# Patient Record
Sex: Male | Born: 1955 | Race: White | Hispanic: No | Marital: Married | Smoking: Former smoker
Health system: Southern US, Community
[De-identification: ages and names within clinical notes are randomized; demographics above are authoritative.]

## PROBLEM LIST (undated history)

## (undated) DIAGNOSIS — I10 Essential (primary) hypertension: Secondary | ICD-10-CM

## (undated) DIAGNOSIS — Z139 Encounter for screening, unspecified: Secondary | ICD-10-CM

## (undated) DIAGNOSIS — F419 Anxiety disorder, unspecified: Secondary | ICD-10-CM

## (undated) DIAGNOSIS — E785 Hyperlipidemia, unspecified: Secondary | ICD-10-CM

## (undated) HISTORY — DX: Encounter for screening, unspecified: Z13.9

## (undated) HISTORY — DX: Essential (primary) hypertension: I10

## (undated) HISTORY — DX: Anxiety disorder, unspecified: F41.9

## (undated) HISTORY — DX: Hyperlipidemia, unspecified: E78.5

---

## 1996-06-20 HISTORY — PX: BACK SURGERY: SHX140

## 2000-01-31 ENCOUNTER — Emergency Department (HOSPITAL_COMMUNITY): Admission: EM | Admit: 2000-01-31 | Discharge: 2000-01-31 | Payer: Self-pay | Admitting: Emergency Medicine

## 2004-06-10 ENCOUNTER — Emergency Department (HOSPITAL_COMMUNITY): Admission: EM | Admit: 2004-06-10 | Discharge: 2004-06-10 | Payer: Self-pay | Admitting: Emergency Medicine

## 2006-03-06 ENCOUNTER — Ambulatory Visit: Payer: Self-pay | Admitting: Gastroenterology

## 2006-03-22 ENCOUNTER — Ambulatory Visit: Payer: Self-pay | Admitting: Gastroenterology

## 2007-03-21 ENCOUNTER — Encounter: Admission: RE | Admit: 2007-03-21 | Discharge: 2007-03-21 | Payer: Self-pay | Admitting: Neurosurgery

## 2008-05-28 ENCOUNTER — Ambulatory Visit: Payer: Self-pay | Admitting: Cardiology

## 2008-05-28 ENCOUNTER — Encounter: Payer: Self-pay | Admitting: Internal Medicine

## 2008-05-28 LAB — CONVERTED CEMR LAB
ALT: 20 units/L (ref 0–53)
AST: 15 units/L (ref 0–37)
Albumin: 4.5 g/dL (ref 3.5–5.2)
Alkaline Phosphatase: 65 units/L (ref 39–117)
BUN: 13 mg/dL (ref 6–23)
CO2: 26 meq/L (ref 19–32)
CRP: 0.1 mg/dL (ref <0.6)
Calcium: 9.2 mg/dL (ref 8.4–10.5)
Chloride: 105 meq/L (ref 96–112)
Cholesterol: 235 mg/dL — ABNORMAL HIGH (ref 0–200)
Creatinine, Ser: 0.75 mg/dL (ref 0.40–1.50)
Glucose, Bld: 106 mg/dL — ABNORMAL HIGH (ref 70–99)
HDL: 41 mg/dL (ref >39)
LDL Cholesterol: 153 mg/dL — ABNORMAL HIGH (ref 0–99)
Potassium: 4.4 meq/L (ref 3.5–5.3)
Sodium: 142 meq/L (ref 135–145)
Total Bilirubin: 0.7 mg/dL (ref 0.3–1.2)
Total CHOL/HDL Ratio: 5.7
Total Protein: 6.5 g/dL (ref 6.0–8.3)
Triglycerides: 203 mg/dL — ABNORMAL HIGH (ref <150)
VLDL: 41 mg/dL — ABNORMAL HIGH (ref 0–40)

## 2008-05-30 ENCOUNTER — Ambulatory Visit: Payer: Self-pay | Admitting: Internal Medicine

## 2008-06-04 ENCOUNTER — Ambulatory Visit: Payer: Self-pay

## 2008-06-04 ENCOUNTER — Ambulatory Visit: Payer: Self-pay | Admitting: Cardiology

## 2008-06-04 DIAGNOSIS — Z139 Encounter for screening, unspecified: Secondary | ICD-10-CM

## 2008-06-04 DIAGNOSIS — I10 Essential (primary) hypertension: Secondary | ICD-10-CM | POA: Insufficient documentation

## 2008-06-04 DIAGNOSIS — E785 Hyperlipidemia, unspecified: Secondary | ICD-10-CM

## 2008-06-04 HISTORY — DX: Encounter for screening, unspecified: Z13.9

## 2008-08-22 ENCOUNTER — Ambulatory Visit: Payer: Self-pay | Admitting: Internal Medicine

## 2008-08-22 ENCOUNTER — Encounter: Payer: Self-pay | Admitting: Internal Medicine

## 2008-08-26 ENCOUNTER — Ambulatory Visit: Payer: Self-pay | Admitting: Cardiology

## 2008-08-26 ENCOUNTER — Encounter: Payer: Self-pay | Admitting: Internal Medicine

## 2008-08-26 LAB — CONVERTED CEMR LAB
ALT: 23 units/L (ref 0–53)
AST: 18 units/L (ref 0–37)
Albumin: 4.2 g/dL (ref 3.5–5.2)
Alkaline Phosphatase: 65 units/L (ref 39–117)
BUN: 17 mg/dL (ref 6–23)
CO2: 25 meq/L (ref 19–32)
Calcium: 9.2 mg/dL (ref 8.4–10.5)
Chloride: 107 meq/L (ref 96–112)
Cholesterol: 156 mg/dL (ref 0–200)
Creatinine, Ser: 0.77 mg/dL (ref 0.40–1.50)
Glucose, Bld: 94 mg/dL (ref 70–99)
HDL: 54 mg/dL (ref >39)
LDL Cholesterol: 86 mg/dL (ref 0–99)
Potassium: 4.6 meq/L (ref 3.5–5.3)
Sodium: 143 meq/L (ref 135–145)
Total Bilirubin: 0.4 mg/dL (ref 0.3–1.2)
Total CHOL/HDL Ratio: 2.9
Total Protein: 6.4 g/dL (ref 6.0–8.3)
Triglycerides: 82 mg/dL (ref <150)
VLDL: 16 mg/dL (ref 0–40)

## 2009-05-27 ENCOUNTER — Ambulatory Visit: Payer: Self-pay | Admitting: Internal Medicine

## 2009-06-22 ENCOUNTER — Telehealth: Payer: Self-pay | Admitting: Internal Medicine

## 2010-03-05 ENCOUNTER — Ambulatory Visit: Payer: Self-pay | Admitting: Internal Medicine

## 2010-06-02 ENCOUNTER — Telehealth: Payer: Self-pay | Admitting: Internal Medicine

## 2010-06-04 ENCOUNTER — Ambulatory Visit: Payer: Self-pay

## 2010-07-18 LAB — CONVERTED CEMR LAB
ALT: 26 units/L (ref 0–53)
AST: 20 units/L (ref 0–37)
Albumin: 4.9 g/dL (ref 3.5–5.2)
Alkaline Phosphatase: 60 units/L (ref 39–117)
BUN: 14 mg/dL (ref 6–23)
CO2: 25 meq/L (ref 19–32)
Calcium: 9.4 mg/dL (ref 8.4–10.5)
Chloride: 103 meq/L (ref 96–112)
Cholesterol: 175 mg/dL (ref 0–200)
Creatinine, Ser: 0.7 mg/dL (ref 0.40–1.50)
Glucose, Bld: 98 mg/dL (ref 70–99)
HDL: 49 mg/dL (ref >39)
LDL Cholesterol: 94 mg/dL (ref 0–99)
Potassium: 3.9 meq/L (ref 3.5–5.3)
Sodium: 137 meq/L (ref 135–145)
Total Bilirubin: 0.7 mg/dL (ref 0.3–1.2)
Total CHOL/HDL Ratio: 3.6
Total CK: 204 units/L (ref 7–232)
Total Protein: 6.9 g/dL (ref 6.0–8.3)
Triglycerides: 159 mg/dL — ABNORMAL HIGH (ref <150)
VLDL: 32 mg/dL (ref 0–40)

## 2010-07-22 NOTE — Assessment & Plan Note (Signed)
Summary: ROV/ALT  Medications Added RED YEAST RICE 600 MG CAPS (RED YEAST RICE EXTRACT) two times a day by mouth      Allergies Added: NKDA  Visit Type:  Follow-up Primary Provider:  Julieanne Manson, M.D.  CC:  c/o muscle aches.  Denies chest pain or shortness of breath..  History of Present Illness: Danny Navarro is a delightful 55 year old male with history of hypertension and hyperlipidemia who presents today for routine followup.  He underwent a screening treadmill test and went 15 and a half minutes on a Bruce protocol. There was no chest pain or shortness of breath. He had nonspecific upsloping ST changes. We recently started him on simvastatin. Returns for routine f/u.  Overall doing well. Continues to ride his bike. No CP or SOB. Tolerating statin ok. Continues to have musle aches and fatigue. Stopped simva for a while and feels like it was better. (LDL went from 153 to 90). BP went controlled at home bu always higher in doctor's office.    Current Medications (verified): 1)  Altace 5 Mg Caps (Ramipril) .Marland Kitchen.. 1 By Mouth Daily 2)  Century  Tabs (Multiple Vitamins-Minerals) .Marland Kitchen.. 1 By Mouth Daily 3)  Simvastatin 20 Mg Tabs (Simvastatin) .Marland Kitchen.. 1 By Mouth Daily  Allergies (verified): No Known Drug Allergies  Past History:  Past Medical History: Last updated: 08/22/2008  1. Hyperlipidemia  2. Hypertension  3. Screening GXT 06/04/08: walked 15:25. NO CO or ECG changes  Past Surgical History: Last updated: 06/04/2008 back surgery x 2  Family History: Last updated: 08/21/2008  Notable for a mother who has coronary artery disease in   her 51s.  Father died at 55 due to cancer.  There is no family history   of premature coronary artery disease.   Social History: Last updated: 08/21/2008 Full Time Married  Tobacco Use - Former.  Regular Exercise - yes He is married with 2 kids.  He owns his own business.   He smoked previously, but quit in 1980.  He does not do any drugs.   Does   not drink alcohol.      Risk Factors: Exercise: yes (06/04/2008)  Risk Factors: Smoking Status: quit (06/04/2008)  Review of Systems       As per HPI and past medical history; otherwise all systems negative.   Vital Signs:  Patient profile:   55 year old male Height:      72 inches Weight:      184 pounds BMI:     25.05 Pulse rate:   68 / minute BP sitting:   132 / 86  (left arm) Cuff size:   regular  Vitals Entered By: Bishop Dublin, CMA (March 05, 2010 2:41 PM)  Physical Exam  General:  Well appearing. no resp difficulty HEENT: normal Neck: supple. no JVD. Carotids 2+ bilat; not bruits. No lymphadenopathy or thryomegaly appreciated. Cor: PMI nondisplaced. Regular rate & rhythm. No rubs, gallops, murmur. Lungs: clear Abdomen: soft, nontender, nondistended. No hepatosplenomegaly. No bruits or masses. Good bowel sounds. Extremities: no cyanosis, clubbing, rash, edema Neuro: alert & orientedx3, cranial nerves grossly intact. moves all 4 extremities w/o difficulty. affect pleasant    Impression & Recommendations:  Problem # 1:  HYPERTENSION, BENIGN (ICD-401.1) BP well controlled at home. mild white coat syndrome here. Continue current regimen.  Problem # 2:  HYPERLIPIDEMIA-MIXED (ICD-272.4) Will stop simva. Discussed options of low-dose crestor vs red yeast rice - he will try the latter. 600  two times a day. Repeat lipid and  liver panel in 3 months. Goal LDL < 100.  Other Orders: EKG w/ Interpretation (93000)  Patient Instructions: 1)  Your physician recommends that you return for a FASTING lipid profile: 3 months  2)  Your physician has recommended you make the following change in your medication: STOP simvastatin START Red Yeast Rice 3)  Your physician wants you to follow-up in:  3-4 months  You will receive a reminder letter in the mail two months in advance. If you don't receive a letter, please call our office to schedule the follow-up  appointment.

## 2010-07-22 NOTE — Progress Notes (Signed)
Summary: RX   Phone Note Refill Request Call back at Home Phone [phone] Message from:  Patient on June 02, 2010 4:22 PM  Refills Requested: Medication #1:  ramipril [facility] [address]  Initial call taken by: Harlon Flor,  June 02, 2010 4:23 PM    Prescriptions: ALTACE 5 MG CAPS (RAMIPRIL) 1 by mouth daily  #30 Capsule x 8   Entered by:   Bishop Dublin, CMA   Authorized by:   Dolores Patty, MD, [facility]   Signed by:   Bishop Dublin, CMA on 06/02/2010   Method used:   Electronically to        [id number]       [address]       [place], [place]  [address]       Ph: [phone]       Fax: [phone]   RxID:   [id number] ALTACE 5 MG CAPS (RAMIPRIL) 1 by mouth daily  #30 Capsule x 8   Entered by:   Bishop Dublin, CMA   Authorized by:   Dolores Patty, MD, [facility]   Signed by:   Bishop Dublin, CMA on 06/02/2010   Method used:   Electronically to        [id number]       [address]       [place], [place]  [address]       Ph: [phone]       Fax: [phone]   RxID:   [id number]

## 2010-07-22 NOTE — Progress Notes (Signed)
Summary: MEDS   Phone Note Call from Patient Call back at Home Phone [phone]   Caller: SELF Call For: Nial Hawe Summary of Call: HAS BEEN GETTING ALTACE 5 MG FILLED BY [facility]-THEY WILL NOT REFILL UNTIL HE COMES IN FOR AN APPT-CAN WE REFILL THIS FOR HIM? Initial call taken by: Harlon Flor,  June 22, 2009 3:48 PM    Prescriptions: ALTACE 5 MG CAPS (RAMIPRIL) 1 by mouth daily  #30 x 6   Entered by:   Charlena Cross, RN, BSN   Authorized by:   Dolores Patty, MD, [facility]   Signed by:   Charlena Cross, RN, BSN on 06/22/2009   Method used:   [facility]        [facility]  [address] [id number]       [address]       [place], [place]  [address]       Ph: [phone]       Fax: [phone]   RxID:   [id number]

## 2010-11-02 NOTE — Procedures (Signed)
[facility]                              EXERCISE RHILEY, SOLEM                          MRN:          [id number]  DATE:06/04/2008                            DOB:          23-Aug-1955    The patient's stress test was performed on June 04, 2008, for Dr.  Charlies Constable.   PRIMARY CARDIOLOGIST:  Bevelyn Buckles. Bensimhon, MD   The patient exercised 15 minutes 25 seconds per the Bruce protocol  obtaining peak heart rate of 181 which was 108% with predicted maximum  heart rate.  Blood pressure peaked at 154/111 and came down nicely.  Test was stopped due to thirst.  He had no chest pain.  He did have some  upsloping ST changes, but overall had excellent exercise tolerance or  exercise capacity and nonspecific ST changes.  The patient will follow  up with Dr. Gala Romney in 3 months.      Jacolyn Reedy, PA-C  Electronically Signed      Everardo Beals. Juanda Chance, MD, [facility]  Electronically Signed   ML/MedQ  DD: 06/04/2008  DT: 06/05/2008  Job #: [id number]

## 2010-11-02 NOTE — Assessment & Plan Note (Signed)
[facility] OFFICE NOTE   Danny, Navarro                          MRN:          [id number]  DATE:05/30/2008                            DOB:          1956-06-16    PRIMARY CARE PHYSICIAN:  Dr. Julieanne Manson.   REASON FOR CONSULT:  Cardiac screening.   HISTORY OF PRESENT ILLNESS:  Danny Navarro is a very pleasant 55 year old man,  who is the son of Scarlette Calico in our clinic.  He has a history of  hypertension and hyperlipidemia and is here for preventive cardiac  visit.   He is a very active man.  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  He states he rides his bike 3-4 times a week.  When it is  warm, he does about a 100 miles total per week outside.  Otherwise, when  it is cold, he rides his trainer inside.  He has no chest pain or  shortness of breath.  He does a pretty good job of watching his diet.  He does not eat a lot of fatty foods, but does eat a lot of starches.  He occasionally monitors his blood pressure at home with systolics in  the 120-130 range.  He had his cholesterol checked recently, showed a  total cholesterol 235, triglycerides 203, LDL 153, and HDL 41.  His  fasting glucose was 106.  His CRP was 0.1.   REVIEW OF SYSTEMS:  He has some aches and pains now and then, but no  palpitations.  No neurologic findings.  No other significant problems.  Remainder of review of systems is negative except for HPI and problem  list.   PROBLEM LIST:  1. Hypertension.  2. Hyperlipidemia.  3. History of back surgery.   CURRENT MEDICATIONS:  1. Altace 5 mg a day.  2. Multivitamin.  3. Omega-3.  4. Vitamin C.  5. Vitamin E.   ALLERGIES:  DECLOMYCIN.   SOCIAL HISTORY:  He is married with 2 kids.  He owns his own business.  He smoked previously, but quit in 1980.  He does not do any drugs.  Does  not drink alcohol.   FAMILY HISTORY:  Notable for a mother who has coronary artery disease in  her  18s.  Father died at 81 due to cancer.  There is no family history  of premature coronary artery disease.   PHYSICAL EXAMINATION:  GENERAL:  He is well appearing.  He is an  athletic, in no acute distress.  He ambulates around the clinic without  any respiratory difficulty.  VITAL SIGNS:  Blood pressure is 130/94, heart rate is 79, and weight is  193.  HEENT:  Normal.  NECK:  Supple.  There is no JVD.  Carotids are 2+ bilaterally without  any bruits.  There is no lymphadenopathy or thyromegaly.  CARDIAC:  PMI is nondisplaced.  Regular rate and rhythm.  No murmurs,  rubs, or gallops.  LUNGS:  Clear.  ABDOMEN:  Soft, nontender, and nondistended.  No hepatosplenomegaly.  No  bruits.  No  masses.  Good bowel sounds.  EXTREMITIES:  Warm with no cyanosis, clubbing, or edema.  No rash.  No  xanthomas.   EKG shows sinus rhythm at a rate of 79 with no ST-T wave abnormalities.   ASSESSMENT AND PLAN:  1. Hyperlipidemia.  Cholesterol is significantly elevated, but      fortunately his CRP is low.  We did discuss a possible trial of      diet therapy.  However, his diet is already pretty good.  We will      go ahead then and start him on simvastatin 20 mg a day with a goal      LDL of less than 100.  2. Hypertension.  Blood pressure is borderline.  I asked him to keep a      blood pressure log and bring it to me.  Goal would be to keep his      systolics under 130 and his diastolics under 85.  3. Elevated fasting glucose.  I did suggest that this is a pre-      diabetic state and told him to continue his exercise and watch his      starches.   DISPOSITION:  We will see him back in 3 months for followup on his  cholesterol.  In the interim, we will get a screening treadmill stress  test and start him on baby aspirin.     Danny Buckles. Bensimhon, MD  Electronically Signed    DRB/MedQ  DD: 05/30/2008  DT: 05/31/2008  Job #: [id number]   cc:   Julieanne Manson

## 2010-12-03 ENCOUNTER — Encounter: Payer: Self-pay | Admitting: Cardiology

## 2011-03-08 ENCOUNTER — Other Ambulatory Visit: Payer: Self-pay | Admitting: Internal Medicine

## 2011-11-25 ENCOUNTER — Other Ambulatory Visit (HOSPITAL_COMMUNITY): Payer: Self-pay | Admitting: *Deleted

## 2011-11-25 MED ORDER — RAMIPRIL 5 MG PO CAPS: 5.0000 mg | ORAL_CAPSULE | Freq: Every day | ORAL | Status: DC

## 2013-05-19 ENCOUNTER — Emergency Department: Payer: Self-pay | Admitting: Emergency Medicine

## 2013-05-19 LAB — RAPID INFLUENZA A&B ANTIGENS

## 2015-05-09 ENCOUNTER — Other Ambulatory Visit: Payer: Self-pay | Admitting: Family Medicine

## 2015-09-22 ENCOUNTER — Ambulatory Visit: Payer: BLUE CROSS/BLUE SHIELD | Admitting: Family Medicine

## 2015-10-09 ENCOUNTER — Ambulatory Visit (INDEPENDENT_AMBULATORY_CARE_PROVIDER_SITE_OTHER): Payer: BLUE CROSS/BLUE SHIELD | Admitting: Family Medicine

## 2015-10-09 ENCOUNTER — Encounter: Payer: Self-pay | Admitting: Family Medicine

## 2015-10-09 VITALS — BP 120/80 | HR 78 | Temp 98.1°F | Ht 72.0 in | Wt 192.8 lb

## 2015-10-09 DIAGNOSIS — E785 Hyperlipidemia, unspecified: Secondary | ICD-10-CM

## 2015-10-09 DIAGNOSIS — I10 Essential (primary) hypertension: Secondary | ICD-10-CM

## 2015-10-09 DIAGNOSIS — M545 Low back pain, unspecified: Secondary | ICD-10-CM | POA: Insufficient documentation

## 2015-10-09 NOTE — Progress Notes (Signed)
Patient ID: Danny Navarro, male   DOB: 1955/11/21, 60 y.o.   MRN: [id number]  Marikay Alar, MD Phone: [phone]  Danny Navarro is a 60 y.o. male who presents today for new patient visit.   HYPERTENSION Disease Monitoring Home BP Monitoring 120's/80's Chest pain- no    Dyspnea- no Medications Compliance-  Taking ramipril. Lightheadedness-  no  Edema- no  HYPERLIPIDEMIA Symptoms Chest pain on exertion:  no   Leg claudication:   no Medications: Compliance- taking red yeast rice Right upper quadrant pain- no  Muscle aches- no Patient notes he exercises by cycling 3-4 days a week. Tries to eat healthy. Not much fast food. Has previously been on Crestor though had significant myalgias with this  Low back pain: Patient notes chronic intermittent history of low back pain that occasionally flares. Notes it is a dull ache. No numbness or weakness. No loss of bowel or bladder function, saddle anesthesia, fevers, or history of cancer. Had an MRI as 7-8 months ago that he reports possibly had some mild arthritis or disc changes on it   Active Ambulatory Problems    Diagnosis Date Noted  . Hyperlipidemia 06/04/2008  . HYPERTENSION, BENIGN 06/04/2008  . Low back pain 10/09/2015   Resolved Ambulatory Problems    Diagnosis Date Noted  . No Resolved Ambulatory Problems   Past Medical History  Diagnosis Date  . Hypertension   . Screening 06/04/2008    Family History  Problem Relation Age of Onset  . Coronary artery disease Mother   . Cancer Father     Social History   Social History  . Marital Status: Married    Spouse Name: N/A  . Number of Children: N/A  . Years of Education: N/A   Occupational History  . Full time     Owns his own business   Social History Main Topics  . Smoking status: Former Smoker    Quit date: 06/20/1978  . Smokeless tobacco: Not on file  . Alcohol Use: No  . Drug Use: No  . Sexual Activity: Not on file   Other Topics Concern  . Not on file     Social History Narrative   Married with 2 kids   Owns his own business   Gets regular exercise    ROS  General:  Negative for nexplained weight loss, fever Skin: Negative for new or changing mole, sore that won't heal HEENT: Negative for trouble hearing, trouble seeing, ringing in ears, mouth sores, hoarseness, change in voice, dysphagia. CV:  Negative for chest pain, dyspnea, edema, palpitations Resp: Negative for cough, dyspnea, hemoptysis GI: Negative for nausea, vomiting, diarrhea, constipation, abdominal pain, melena, hematochezia. GU: Negative for dysuria, incontinence, urinary hesitance, hematuria, vaginal or penile discharge, polyuria, sexual difficulty, lumps in testicle or breasts MSK: Negative for muscle cramps or aches, joint pain or swelling Neuro: Negative for headaches, weakness, numbness, dizziness, passing out/fainting Psych: Negative for depression, anxiety, memory problems  Objective  Physical Exam Filed Vitals:   10/09/15 0836  BP: 120/80  Pulse: 78  Temp: 98.1 F (36.7 C)    BP Readings from Last 3 Encounters:  10/09/15 120/80  03/05/10 132/86  05/27/09 128/78   Wt Readings from Last 3 Encounters:  10/09/15 192 lb 12.8 oz (87.454 kg)  03/05/10 184 lb (83.462 kg)  05/27/09 190 lb 6.4 oz (86.365 kg)    Physical Exam  Constitutional: He is well-developed, well-nourished, and in no distress.  HENT:  Head: Normocephalic and atraumatic.  Right Ear: External ear normal.  Left Ear: External ear normal.  Eyes: Conjunctivae are normal. Right eye exhibits no discharge. Left eye exhibits no discharge.  Cardiovascular: Normal rate, regular rhythm and normal heart sounds.   Pulmonary/Chest: Effort normal and breath sounds normal.  Abdominal: Soft. Bowel sounds are normal. He exhibits no distension. There is no tenderness. There is no rebound and no guarding.  Musculoskeletal: He exhibits no edema.  No midline spine tenderness, no midline spine step-off,  no muscular back tenderness  Neurological: He is alert. Gait normal.  5 out of 5 strength bilateral quads, hamstrings, plantar flexion, and dorsiflexion, sensation light touch intact bilaterally lower extremities, 2+ patellar reflexes  Skin: Skin is warm and dry. He is not diaphoretic.  Psychiatric: Mood and affect normal.     Assessment/Plan:   Hyperlipidemia Asymptomatic. Tolerating red yeast rice. We will request records from his prior PCP and plan for lab work in 6 weeks at his physical exam.  HYPERTENSION, BENIGN At goal. Continue current medication. We'll check lab work at his physical in 6 weeks.  Low back pain Chronic intermittent issue. Benign exam. Neurologically intact. No red flags. Patient will continue to monitor.     Marikay AlarEric Fenix Rorke, MD [facility]

## 2015-10-09 NOTE — Assessment & Plan Note (Signed)
Chronic intermittent issue. Benign exam. Neurologically intact. No red flags. Patient will continue to monitor.

## 2015-10-09 NOTE — Progress Notes (Signed)
Pre visit review using our clinic review tool, if applicable. No additional management support is needed unless otherwise documented below in the visit note. 

## 2015-10-09 NOTE — Assessment & Plan Note (Signed)
At goal. Continue current medication. We'll check lab work at his physical in 6 weeks.

## 2015-10-09 NOTE — Assessment & Plan Note (Signed)
Asymptomatic. Tolerating red yeast rice. We will request records from his prior PCP and plan for lab work in 6 weeks at his physical exam.

## 2015-10-09 NOTE — Patient Instructions (Signed)
Nice to meet you. Please continue current medications and continue to exercise as you are. We will see you back in early June for a physical and lab work.

## 2015-11-11 ENCOUNTER — Other Ambulatory Visit: Payer: Self-pay | Admitting: Family Medicine

## 2015-11-17 ENCOUNTER — Other Ambulatory Visit: Payer: Self-pay | Admitting: Family Medicine

## 2015-12-02 ENCOUNTER — Encounter: Payer: Self-pay | Admitting: Family Medicine

## 2015-12-02 ENCOUNTER — Ambulatory Visit (INDEPENDENT_AMBULATORY_CARE_PROVIDER_SITE_OTHER): Payer: BLUE CROSS/BLUE SHIELD | Admitting: Family Medicine

## 2015-12-02 VITALS — BP 124/86 | HR 81 | Temp 97.9°F | Ht 72.0 in | Wt 192.6 lb

## 2015-12-02 DIAGNOSIS — Z1211 Encounter for screening for malignant neoplasm of colon: Secondary | ICD-10-CM

## 2015-12-02 DIAGNOSIS — Z13220 Encounter for screening for lipoid disorders: Secondary | ICD-10-CM | POA: Diagnosis not present

## 2015-12-02 DIAGNOSIS — Z0000 Encounter for general adult medical examination without abnormal findings: Secondary | ICD-10-CM | POA: Diagnosis not present

## 2015-12-02 DIAGNOSIS — Z1159 Encounter for screening for other viral diseases: Secondary | ICD-10-CM

## 2015-12-02 DIAGNOSIS — Z125 Encounter for screening for malignant neoplasm of prostate: Secondary | ICD-10-CM

## 2015-12-02 LAB — COMPREHENSIVE METABOLIC PANEL
ALK PHOS: 59 U/L (ref 39–117)
ALT: 20 U/L (ref 0–53)
AST: 15 U/L (ref 0–37)
Albumin: 4.5 g/dL (ref 3.5–5.2)
BUN: 15 mg/dL (ref 6–23)
CO2: 28 mEq/L (ref 19–32)
Calcium: 9.6 mg/dL (ref 8.4–10.5)
Chloride: 102 mEq/L (ref 96–112)
Creatinine, Ser: 0.73 mg/dL (ref 0.40–1.50)
GFR: 116.15 mL/min (ref >60.00)
Glucose, Bld: 103 mg/dL — ABNORMAL HIGH (ref 70–99)
Potassium: 4.8 mEq/L (ref 3.5–5.1)
Sodium: 137 mEq/L (ref 135–145)
Total Bilirubin: 0.5 mg/dL (ref 0.2–1.2)
Total Protein: 6.6 g/dL (ref 6.0–8.3)

## 2015-12-02 LAB — LIPID PANEL
CHOL/HDL RATIO: 5
Cholesterol: 205 mg/dL — ABNORMAL HIGH (ref 0–200)
HDL: 43.2 mg/dL (ref >39.00)
LDL Cholesterol: 123 mg/dL — ABNORMAL HIGH (ref 0–99)
NonHDL: 161.62
Triglycerides: 191 mg/dL — ABNORMAL HIGH (ref 0.0–149.0)
VLDL: 38.2 mg/dL (ref 0.0–40.0)

## 2015-12-02 LAB — PSA: PSA: 0.76 ng/mL (ref 0.10–4.00)

## 2015-12-02 NOTE — Patient Instructions (Signed)
Nice to see you. Please continue to exercise. Please watch what you eat as well. We will check lab work today and call with the results. Please consider the shingles vaccine.

## 2015-12-02 NOTE — Progress Notes (Signed)
Patient ID: Danny Navarro, male   DOB: [birth year], 60 y.o.   MRN: [id number]  Danny Rumps, MD Phone: [phone]  Danny Navarro is a 60 y.o. male who presents today for physical exam.  Patient reports overall he is doing quite well. Exercises by cycling 3-4 times a week. Rides 100-150 miles per week. Diet is described as good. Not much junk food. He eats mostly chicken and fish. Drinks mostly water. Rare soda. No prior hepatitis C check. No prior HIV testing. Declines HIV testing. Tetanus up-to-date in the last 1-2 years. Unsure about Zostavax and notes he has not had this previously. Did have chickenpox as a child. Colonoscopy up-to-date though his skin be due in October of this year. Prior PSAs are reportedly normal. No family history of prostate cancer. No tobacco use, alcohol use, or illicit drug use. Sees a dentist. Intermittently sees an ophthalmologist. Does note using reading glasses.   Active Ambulatory Problems    Diagnosis Date Noted  . Hyperlipidemia 06/04/2008  . HYPERTENSION, BENIGN 06/04/2008  . Low back pain 10/09/2015  . Routine general medical examination at a health care facility 12/02/2015   Resolved Ambulatory Problems    Diagnosis Date Noted  . No Resolved Ambulatory Problems   Past Medical History  Diagnosis Date  . Hypertension   . Screening 06/04/2008    Family History  Problem Relation Age of Onset  . Coronary artery disease Mother   . Cancer Father     Social History   Social History  . Marital Status: Married    Spouse Name: N/A  . Number of Children: N/A  . Years of Education: N/A   Occupational History  . Full time     Owns his own business   Social History Main Topics  . Smoking status: Former Smoker    Quit date: 06/20/1978  . Smokeless tobacco: Not on file  . Alcohol Use: No  . Drug Use: No  . Sexual Activity: Not on file   Other Topics Concern  . Not on file   Social History Narrative   Married with 2 kids   Owns his  own business   Gets regular exercise    ROS  General:  Negative for nexplained weight loss, fever Skin: Negative for new or changing mole, sore that won't heal HEENT: Negative for trouble hearing, trouble seeing, ringing in ears, mouth sores, hoarseness, change in voice, dysphagia. CV:  Negative for chest pain, dyspnea, edema, palpitations Resp: Negative for cough, dyspnea, hemoptysis GI: Negative for nausea, vomiting, diarrhea, constipation, abdominal pain, melena, hematochezia. GU: Negative for dysuria, incontinence, urinary hesitance, hematuria, vaginal or penile discharge, polyuria, sexual difficulty, lumps in testicle or breasts MSK: Negative for muscle cramps or aches, joint pain or swelling Neuro: Negative for headaches, weakness, numbness, dizziness, passing out/fainting Psych: Negative for depression, anxiety, memory problems  Objective  Physical Exam Filed Vitals:   12/02/15 0821  BP: 124/86  Pulse: 81  Temp: 97.9 F (36.6 C)    BP Readings from Last 3 Encounters:  12/02/15 124/86  10/09/15 120/80  03/05/10 132/86   Wt Readings from Last 3 Encounters:  12/02/15 192 lb 9.6 oz (87.363 kg)  10/09/15 192 lb 12.8 oz (87.454 kg)  03/05/10 184 lb (83.462 kg)    Physical Exam  Constitutional: He is well-developed, well-nourished, and in no distress.  HENT:  Head: Normocephalic and atraumatic.  Right Ear: External ear normal.  Left Ear: External ear normal.  Mouth/Throat: Oropharynx is clear and  moist. No oropharyngeal exudate.  Eyes: Conjunctivae are normal. Pupils are equal, round, and reactive to light.  Neck: Neck supple.  Cardiovascular: Normal rate, regular rhythm and normal heart sounds.   Pulmonary/Chest: Effort normal and breath sounds normal.  Abdominal: Soft. Bowel sounds are normal. He exhibits no distension. There is no tenderness. There is no rebound and no guarding.  Genitourinary: Rectum normal and prostate normal.  Musculoskeletal: He exhibits no  edema.  Lymphadenopathy:    He has no cervical adenopathy.  Neurological: He is alert. Gait normal.  Skin: Skin is warm and dry. He is not diaphoretic.  Psychiatric: Mood and affect normal.     Assessment/Plan:   Routine general medical examination at a health care facility Patient doing quite well. Diet and exercise are adequate. Tetanus vaccine is up-to-date. Patient is unsure about Zostavax and he will do some research on this and let us know. GI referral placed for colonoscopy after 03/22/16. Lab work checked per below. Continue to see dentist. He will see eye doctor for vision check.     Orders Placed This Encounter  Procedures  . Comp Met (CMET)  . Lipid Profile  . Hepatitis C Antibody  . PSA  . Ambulatory referral to Gastroenterology    Referral Priority:  Routine    Referral Type:  Consultation    Referral Reason:  Specialty Services Required    Number of Visits Requested:  1    No orders of the defined types were placed in this encounter.     Danny Rumps, MD [place]

## 2015-12-02 NOTE — Progress Notes (Signed)
Pre visit review using our clinic review tool, if applicable. No additional management support is needed unless otherwise documented below in the visit note. 

## 2015-12-02 NOTE — Assessment & Plan Note (Addendum)
Patient doing quite well. Diet and exercise are adequate. Tetanus vaccine is up-to-date. Patient is unsure about Zostavax and he will do some research on this and let us know. GI referral placed for colonoscopy after 03/22/16. Lab work checked per below. Continue to see dentist. He will see eye doctor for vision check.

## 2015-12-03 LAB — HEPATITIS C ANTIBODY: HCV Ab: NEGATIVE

## 2016-01-16 ENCOUNTER — Other Ambulatory Visit: Payer: Self-pay | Admitting: Family Medicine

## 2016-01-18 ENCOUNTER — Other Ambulatory Visit: Payer: Self-pay | Admitting: Family Medicine

## 2016-01-18 NOTE — Telephone Encounter (Signed)
Dr. Sullivan Lone, It looks like it has been over a year since we have seen him and his last 2 visits were with another Family Med doc.

## 2016-01-19 NOTE — Telephone Encounter (Signed)
Can we refill this? Patient seen 11/2015

## 2016-02-01 ENCOUNTER — Encounter: Payer: Self-pay | Admitting: Gastroenterology

## 2016-03-23 ENCOUNTER — Encounter: Payer: Self-pay | Admitting: Family Medicine

## 2016-03-23 ENCOUNTER — Ambulatory Visit (INDEPENDENT_AMBULATORY_CARE_PROVIDER_SITE_OTHER): Payer: BLUE CROSS/BLUE SHIELD | Admitting: Family Medicine

## 2016-03-23 DIAGNOSIS — J209 Acute bronchitis, unspecified: Secondary | ICD-10-CM | POA: Diagnosis not present

## 2016-03-23 DIAGNOSIS — G8929 Other chronic pain: Secondary | ICD-10-CM

## 2016-03-23 DIAGNOSIS — M545 Low back pain: Secondary | ICD-10-CM

## 2016-03-23 MED ORDER — AZITHROMYCIN 250 MG PO TABS: ORAL_TABLET | ORAL | 0 refills | Status: DC

## 2016-03-23 NOTE — Patient Instructions (Signed)
You likely a bronchitis. We will treat with azithromycin. Please continue to stay active with your low back discomfort. Please do the exercises that physical therapy showed you previously. You can also take an anti-inflammatory such as ibuprofen or Aleve. If you develop numbness, weakness, loss of bowel or bladder function, numbness between her legs, cough productive of blood, fevers, or shortness of breath please seek medical attention.

## 2016-03-23 NOTE — Assessment & Plan Note (Signed)
History most consistent with bronchitis. Benign exam. Given duration we'll treat with azithromycin. He'll continue over-the-counter cough suppressants. He is given return precautions.

## 2016-03-23 NOTE — Progress Notes (Signed)
  Marikay AlarEric Muaad Boehning, MD Phone: [phone]  Lavina HammanMark K Joanne GavelSutton is a 60 y.o. male who presents today for same-day visit.  Patient notes 2-3 weeks ago he had some sinus pressure and drainage. Notes this has improved though now he has significant chest congestion and is coughing up green mucus. No fevers. No shortness of breath. Maybe some wheezing over the weekend. Has been doing over-the-counter NyQuil.  Low back pain: Patient notes he has had a chronic history of low back pain. Has been dealing with this for a year. Is in his right low back. Is followed by orthopedics for this. Had an injection about 9 months ago and then one month ago. This most recent one didn't help. Notes it is just a dull discomfort. No radiation down his legs. No numbness, weakness, loss of bowel or bladder function, saddle anesthesia, or fevers. Has seen physical therapy previously. Is not currently doing his exercises. Does stay active and this helps.  PMH: Former smoker   ROS see history of present illness  Objective  Physical Exam Vitals:   03/23/16 1033  BP: 112/84  Pulse: 80  Temp: 98.4 F (36.9 C)    BP Readings from Last 3 Encounters:  03/23/16 112/84  12/02/15 124/86  10/09/15 120/80   Wt Readings from Last 3 Encounters:  03/23/16 190 lb 4 oz (86.3 kg)  12/02/15 192 lb 9.6 oz (87.4 kg)  10/09/15 192 lb 12.8 oz (87.5 kg)    Physical Exam  Constitutional: He is well-developed, well-nourished, and in no distress.  HENT:  Head: Normocephalic and atraumatic.  Mouth/Throat: Oropharynx is clear and moist. No oropharyngeal exudate.  Normal TMs bilaterally  Eyes: Conjunctivae are normal. Pupils are equal, round, and reactive to light.  Cardiovascular: Normal rate, regular rhythm and normal heart sounds.   Pulmonary/Chest: Effort normal and breath sounds normal.  Musculoskeletal:  No midline spine tenderness, no midline spine step-off, no muscular back tenderness  Neurological: He is alert. Gait normal.    Able to stand from a seated position easily     Assessment/Plan: Please see individual problem list.  Low back pain Chronic intermittent issue. Benign back exam today. No red flags. Discussed physical therapy exercises at home. Continue to monitor. If does not improve would advise follow-up with his orthopedist.  Acute bronchitis History most consistent with bronchitis. Benign exam. Given duration we'll treat with azithromycin. He'll continue over-the-counter cough suppressants. He is given return precautions.   No orders of the defined types were placed in this encounter.   Meds ordered this encounter  Medications  . azithromycin (ZITHROMAX) 250 MG tablet    Sig: Take 500 mg (2 tablets) by mouth today, then take 250 mg (one tablet) by mouth daily on days 2 through 5    Dispense:  6 tablet    Refill:  0    Marikay AlarEric Francisco Ostrovsky, MD [facility] [place]

## 2016-03-23 NOTE — Assessment & Plan Note (Signed)
Chronic intermittent issue. Benign back exam today. No red flags. Discussed physical therapy exercises at home. Continue to monitor. If does not improve would advise follow-up with his orthopedist.

## 2016-03-28 ENCOUNTER — Telehealth: Payer: Self-pay | Admitting: *Deleted

## 2016-03-28 NOTE — Telephone Encounter (Signed)
Pt was seen in the office on 03-23-16, he was prescribed an antibiotic that he has completed, he stated that he continues to have a cough and mucus. He requested another antibiotic . Pt contact [phone]

## 2016-03-28 NOTE — Telephone Encounter (Signed)
Spoke with patient states he continues to  have productive cough.  Patient states cough is worse in the am and at night.    Not taking any over the counter medications.   Patient states he got better after taking after taking antibiotic.   Patient is concerned due to have colonoscopy in 2 weeks.

## 2016-03-29 ENCOUNTER — Ambulatory Visit (AMBULATORY_SURGERY_CENTER): Payer: Self-pay

## 2016-03-29 ENCOUNTER — Encounter: Payer: Self-pay | Admitting: Gastroenterology

## 2016-03-29 VITALS — Ht 72.0 in | Wt 189.8 lb

## 2016-03-29 DIAGNOSIS — Z1211 Encounter for screening for malignant neoplasm of colon: Secondary | ICD-10-CM

## 2016-03-29 MED ORDER — NA SULFATE-K SULFATE-MG SULF 17.5-3.13-1.6 GM/177ML PO SOLN: ORAL | 0 refills | Status: DC

## 2016-03-29 NOTE — Telephone Encounter (Signed)
Noted  

## 2016-03-29 NOTE — Telephone Encounter (Signed)
Patient needs reevaluation to determine if he needs further antibiotics or other treatment. Thanks.

## 2016-03-29 NOTE — Telephone Encounter (Signed)
Patient stated that he does not want to come in right now. If he gets worse then he will call us

## 2016-03-29 NOTE — Progress Notes (Signed)
Per pt, no allergies to soy or egg products.Pt not taking any weight loss meds or using  O2 at home. 

## 2016-04-06 ENCOUNTER — Telehealth: Payer: Self-pay | Admitting: Gastroenterology

## 2016-04-06 NOTE — Telephone Encounter (Signed)
Spoke to patient, he is not sick at the moment, just wanted a prophylactic antibiotic. Patient educated that until he develops symptoms and is diagnosed with Strep throat, it would be at that point that he would be treated. If he feels like he is coming down with this he needs to see his PCP. Advised to use proper handwashing, avoid close contact with his wife.

## 2016-04-12 ENCOUNTER — Encounter: Payer: Self-pay | Admitting: Gastroenterology

## 2016-04-12 ENCOUNTER — Ambulatory Visit (AMBULATORY_SURGERY_CENTER): Payer: BLUE CROSS/BLUE SHIELD | Admitting: Gastroenterology

## 2016-04-12 VITALS — BP 99/79 | HR 88 | Temp 97.8°F | Resp 23 | Ht 72.0 in | Wt 189.0 lb

## 2016-04-12 DIAGNOSIS — Z1211 Encounter for screening for malignant neoplasm of colon: Secondary | ICD-10-CM | POA: Diagnosis present

## 2016-04-12 DIAGNOSIS — Z1212 Encounter for screening for malignant neoplasm of rectum: Secondary | ICD-10-CM | POA: Diagnosis not present

## 2016-04-12 MED ORDER — SODIUM CHLORIDE 0.9 % IV SOLN: 500.0000 mL | INTRAVENOUS | Status: DC

## 2016-04-12 NOTE — Op Note (Signed)
[facility] Patient Name: Danny Navarro Procedure Date: 04/12/2016 8:32 AM MRN: [id number] Endoscopist: Viviann Spare P. Kennadi Albany MD, MD Age: 60 Referring MD:  Date of Birth: 08/12/1955 Gender: Male Account #: [id number] Procedure:                Colonoscopy Indications:              Screening for malignant neoplasm in the colon, last                            colonoscopy 10 years ago was normal Medicines:                Monitored Anesthesia Care Procedure:                Pre-Anesthesia Assessment:                           - Prior to the procedure, a History and Physical                            was performed, and patient medications and                            allergies were reviewed. The patient's tolerance of                            previous anesthesia was also reviewed. The risks                            and benefits of the procedure and the sedation                            options and risks were discussed with the patient.                            All questions were answered, and informed consent                            was obtained. Prior Anticoagulants: The patient has                            taken no previous anticoagulant or antiplatelet                            agents. ASA Grade Assessment: II - A patient with                            mild systemic disease. After reviewing the risks                            and benefits, the patient was deemed in                            satisfactory condition to undergo the procedure.  After obtaining informed consent, the colonoscope                            was passed under direct vision. Throughout the                            procedure, the patient's blood pressure, pulse, and                            oxygen saturations were monitored continuously. The                            Model CF-HQ190L [id number]) scope was introduced                            through the anus  and advanced to the the terminal                            ileum, with identification of the appendiceal                            orifice and IC valve. The colonoscopy was performed                            without difficulty. The patient tolerated the                            procedure well. The quality of the bowel                            preparation was good. The terminal ileum, ileocecal                            valve, appendiceal orifice, and rectum were                            photographed. Scope In: 8:34:20 AM Scope Out: 8:50:56 AM Scope Withdrawal Time: 0 hours 14 minutes 22 seconds  Total Procedure Duration: 0 hours 16 minutes 36 seconds  Findings:                 The perianal and digital rectal examinations were                            normal.                           The terminal ileum appeared normal.                           Non-bleeding internal hemorrhoids were found during                            retroflexion.  The exam was otherwise without abnormality. Complications:            No immediate complications. Estimated blood loss:                            None. Estimated Blood Loss:     Estimated blood loss: none. Impression:               - The examined portion of the ileum was normal.                           - Non-bleeding internal hemorrhoids.                           - The examination was otherwise normal. No polyps. Recommendation:           - Patient has a contact number available for                            emergencies. The signs and symptoms of potential                            delayed complications were discussed with the                            patient. Return to normal activities tomorrow.                            Written discharge instructions were provided to the                            patient.                           - Resume previous diet.                           - Continue present  medications.                           - Repeat colonoscopy in 10 years for screening                            purposes. Viviann Spare P. Masin Shatto MD, MD 04/12/2016 8:53:58 AM This report has been signed electronically.

## 2016-04-12 NOTE — Progress Notes (Signed)
Patient awakening,vss,report to rn 

## 2016-04-12 NOTE — Patient Instructions (Signed)
YOU HAD AN ENDOSCOPIC PROCEDURE TODAY AT THE [facility]:   Refer to the procedure report that was given to you for any specific questions about what was found during the examination.  If the procedure report does not answer your questions, please call your gastroenterologist to clarify.  If you requested that your care partner not be given the details of your procedure findings, then the procedure report has been included in a sealed envelope for you to review at your convenience later.  YOU SHOULD EXPECT: Some feelings of bloating in the abdomen. Passage of more gas than usual.  Walking can help get rid of the air that was put into your GI tract during the procedure and reduce the bloating. If you had a lower endoscopy (such as a colonoscopy or flexible sigmoidoscopy) you may notice spotting of blood in your stool or on the toilet paper. If you underwent a bowel prep for your procedure, you may not have a normal bowel movement for a few days.  Please Note:  You might notice some irritation and congestion in your nose or some drainage.  This is from the oxygen used during your procedure.  There is no need for concern and it should clear up in a day or so.  SYMPTOMS TO REPORT IMMEDIATELY:   Following lower endoscopy (colonoscopy or flexible sigmoidoscopy):  Excessive amounts of blood in the stool  Significant tenderness or worsening of abdominal pains  Swelling of the abdomen that is new, acute  Fever of 100F or higher   Following upper endoscopy (EGD)  Vomiting of blood or coffee ground material  New chest pain or pain under the shoulder blades  Painful or persistently difficult swallowing  New shortness of breath  Fever of 100F or higher  Black, tarry-looking stools  For urgent or emergent issues, a gastroenterologist can be reached at any hour by calling [phone].   DIET:  We do recommend a small meal at first, but then you may proceed to your regular diet.  Drink  plenty of fluids but you should avoid alcoholic beverages for 24 hours.  ACTIVITY:  You should plan to take it easy for the rest of today and you should NOT DRIVE or use heavy machinery until tomorrow (because of the sedation medicines used during the test).    FOLLOW UP: Our staff will call the number listed on your records the next business day following your procedure to check on you and address any questions or concerns that you may have regarding the information given to you following your procedure. If we do not reach you, we will leave a message.  However, if you are feeling well and you are not experiencing any problems, there is no need to return our call.  We will assume that you have returned to your regular daily activities without incident.  If any biopsies were taken you will be contacted by phone or by letter within the next 1-3 weeks.  Please call us at [phone] if you have not heard about the biopsies in 3 weeks.    SIGNATURES/CONFIDENTIALITY: You and/or your care partner have signed paperwork which will be entered into your electronic medical record.  These signatures attest to the fact that that the information above on your After Visit Summary has been reviewed and is understood.  Full responsibility of the confidentiality of this discharge information lies with you and/or your care-partner.  Hemorrhoid and hemorrhoid banding information given. Recall 10 years-2027.

## 2016-04-13 ENCOUNTER — Telehealth: Payer: Self-pay | Admitting: *Deleted

## 2016-04-13 NOTE — Telephone Encounter (Signed)
  Follow up Call-  Call back number 04/12/2016  Post procedure Call Back phone  # [phone]  Permission to leave phone message Yes  Some recent data might be hidden     Patient questions:  Do you have a fever, pain , or abdominal swelling? No. Pain Score  0 *  Have you tolerated food without any problems? Yes.    Have you been able to return to your normal activities? Yes.    Do you have any questions about your discharge instructions: Diet   No. Medications  No. Follow up visit  No.  Do you have questions or concerns about your Care? No.  Actions: * If pain score is 4 or above: No action needed, pain <4.

## 2016-06-02 ENCOUNTER — Encounter: Payer: Self-pay | Admitting: Family Medicine

## 2016-06-02 ENCOUNTER — Ambulatory Visit (INDEPENDENT_AMBULATORY_CARE_PROVIDER_SITE_OTHER): Payer: BLUE CROSS/BLUE SHIELD | Admitting: Family Medicine

## 2016-06-02 VITALS — BP 138/80 | HR 91 | Temp 98.5°F | Wt 192.8 lb

## 2016-06-02 DIAGNOSIS — M545 Low back pain, unspecified: Secondary | ICD-10-CM

## 2016-06-02 DIAGNOSIS — E785 Hyperlipidemia, unspecified: Secondary | ICD-10-CM | POA: Diagnosis not present

## 2016-06-02 DIAGNOSIS — G8929 Other chronic pain: Secondary | ICD-10-CM | POA: Diagnosis not present

## 2016-06-02 DIAGNOSIS — I10 Essential (primary) hypertension: Secondary | ICD-10-CM | POA: Diagnosis not present

## 2016-06-02 NOTE — Assessment & Plan Note (Signed)
Discussed starting on statin or other cholesterol medication. Patient is hesitant to do this at this time thus he will continue to work on diet and exercise. He'll continue red yeast rice. We will check cholesterol at his next visit in 6 months.

## 2016-06-02 NOTE — Addendum Note (Signed)
Addended by: Warden FillersWRIGHT, LATOYA S on: 06/02/2016 11:15 AM   Modules accepted: Orders

## 2016-06-02 NOTE — Patient Instructions (Signed)
Nice to see you. Please continue to work on diet and exercise. Please try to avoid sweets and high fat foods. I have included some diet recommendations below.  Diet Recommendations  Starchy (carb) foods: Bread, rice, pasta, potatoes, corn, cereal, grits, crackers, bagels, muffins, all baked goods.  (Fruits, milk, and yogurt also have carbohydrate, but most of these foods will not spike your blood sugar as the starchy foods will.)  A few fruits do cause high blood sugars; use small portions of bananas (limit to 1/2 at a time), grapes, watermelon, oranges, and most tropical fruits.    Protein foods: Meat, fish, poultry, eggs, dairy foods, and beans such as pinto and kidney beans (beans also provide carbohydrate).   1. Eat at least 3 meals and 1-2 snacks per day. Never go more than 4-5 hours while awake without eating. Eat breakfast within the first hour of getting up.   2. Limit starchy foods to TWO per meal and ONE per snack. ONE portion of a starchy  food is equal to the following:   - ONE slice of bread (or its equivalent, such as half of a hamburger bun).   - 1/2 cup of a "scoopable" starchy food such as potatoes or rice.   - 15 grams of carbohydrate as shown on food label.  3. Include at every meal: a protein food, a carb food, and vegetables and/or fruit.   - Obtain twice the volume of veg's as protein or carbohydrate foods for both lunch and dinner.   - Fresh or frozen veg's are best.   - Keep frozen veg's on hand for a quick vegetable serving.

## 2016-06-02 NOTE — Progress Notes (Signed)
Pre visit review using our clinic review tool, if applicable. No additional management support is needed unless otherwise documented below in the visit note. 

## 2016-06-02 NOTE — Assessment & Plan Note (Addendum)
Well-controlled.  Continue current medication.  Check BMP. 

## 2016-06-02 NOTE — Progress Notes (Signed)
  Marikay AlarEric Sinai Mahany, MD Phone: [phone]  Lavina HammanMark K Joanne GavelSutton is a 60 y.o. male who presents today for f/u.  HYPERTENSION  Disease Monitoring  Home BP Monitoring typically around 116 systolic Chest pain- no    Dyspnea- no Medications  Compliance-  taking ramipril.  Edema- no  Hyperlipidemia: Slightly elevated previously. Discussed patient's 10 year risk based on ASCVD guidelines. Patient currently takes red yeast rice. No claudication. No myalgias. He has been intolerant to statins in the past. Specifically Crestor. Works on diet and exercise. Rides his bike 3 days a week. Eats chicken and salads and vegetables and fruits. Maybe a ginger ale once in a while. No alcohol.  Low back discomfort: Has improved. Saw orthopedics and got a cortisone shot. Feels like a kink at times. No radiation down his legs. No numbness, weakness, loss of bowel or bladder function, or saddle anesthesia.  PMH: Former smoker   ROS see history of present illness  Objective  Physical Exam Vitals:   06/02/16 0800  BP: 138/80  Pulse: 91  Temp: 98.5 F (36.9 C)    BP Readings from Last 3 Encounters:  06/02/16 138/80  04/12/16 99/79  03/23/16 112/84   Wt Readings from Last 3 Encounters:  06/02/16 192 lb 12.8 oz (87.5 kg)  04/12/16 189 lb (85.7 kg)  03/29/16 189 lb 12.8 oz (86.1 kg)    Physical Exam  Constitutional: No distress.  HENT:  Head: Normocephalic and atraumatic.  Cardiovascular: Normal rate, regular rhythm and normal heart sounds.   Pulmonary/Chest: Effort normal and breath sounds normal.  Musculoskeletal:  No midline spine tenderness, no midline spine step-off, no muscular back tenderness  Neurological: He is alert.  5 out of 5 strength bilateral quads, hamstrings, plantar flexion, and dorsiflexion, sensation light touch intact bilateral lower extremities  Skin: Skin is warm and dry. He is not diaphoretic.     Assessment/Plan: Please see individual problem list.  HYPERTENSION,  BENIGN Well-controlled. Continue current medication. Check BMP.  Low back pain Improved. Benign exam. Continue to monitor.  Hyperlipidemia Discussed starting on statin or other cholesterol medication. Patient is hesitant to do this at this time thus he will continue to work on diet and exercise. He'll continue red yeast rice. We will check cholesterol at his next visit in 6 months.   Orders Placed This Encounter  Procedures  . Basic Metabolic Panel (BMET)    Marikay AlarEric Adonai Selsor, MD [facility] [place]

## 2016-06-02 NOTE — Assessment & Plan Note (Signed)
Improved. Benign exam. Continue to monitor.

## 2016-06-08 NOTE — Telephone Encounter (Signed)
Erroneous encounter

## 2016-06-16 ENCOUNTER — Other Ambulatory Visit: Payer: Self-pay | Admitting: Family Medicine

## 2016-10-17 ENCOUNTER — Other Ambulatory Visit: Payer: Self-pay | Admitting: Family Medicine

## 2016-12-02 ENCOUNTER — Ambulatory Visit (INDEPENDENT_AMBULATORY_CARE_PROVIDER_SITE_OTHER): Payer: No Typology Code available for payment source | Admitting: Family Medicine

## 2016-12-02 ENCOUNTER — Encounter: Payer: Self-pay | Admitting: Family Medicine

## 2016-12-02 VITALS — BP 124/80 | HR 70 | Temp 97.9°F | Ht 72.0 in | Wt 189.0 lb

## 2016-12-02 DIAGNOSIS — Z13220 Encounter for screening for lipoid disorders: Secondary | ICD-10-CM | POA: Diagnosis not present

## 2016-12-02 DIAGNOSIS — G8929 Other chronic pain: Secondary | ICD-10-CM | POA: Diagnosis not present

## 2016-12-02 DIAGNOSIS — Z0000 Encounter for general adult medical examination without abnormal findings: Secondary | ICD-10-CM | POA: Diagnosis not present

## 2016-12-02 DIAGNOSIS — Z125 Encounter for screening for malignant neoplasm of prostate: Secondary | ICD-10-CM

## 2016-12-02 DIAGNOSIS — M545 Low back pain: Secondary | ICD-10-CM

## 2016-12-02 LAB — COMPREHENSIVE METABOLIC PANEL
ALT: 19 U/L (ref 0–53)
AST: 16 U/L (ref 0–37)
Albumin: 4.5 g/dL (ref 3.5–5.2)
Alkaline Phosphatase: 57 U/L (ref 39–117)
BILIRUBIN TOTAL: 0.6 mg/dL (ref 0.2–1.2)
BUN: 19 mg/dL (ref 6–23)
CALCIUM: 9.5 mg/dL (ref 8.4–10.5)
CO2: 27 meq/L (ref 19–32)
CREATININE: 0.74 mg/dL (ref 0.40–1.50)
Chloride: 103 mEq/L (ref 96–112)
GFR: 113.96 mL/min (ref >60.00)
Glucose, Bld: 105 mg/dL — ABNORMAL HIGH (ref 70–99)
Potassium: 4.4 mEq/L (ref 3.5–5.1)
Sodium: 138 mEq/L (ref 135–145)
TOTAL PROTEIN: 6.4 g/dL (ref 6.0–8.3)

## 2016-12-02 LAB — LIPID PANEL
CHOL/HDL RATIO: 5
Cholesterol: 206 mg/dL — ABNORMAL HIGH (ref 0–200)
HDL: 43.2 mg/dL (ref >39.00)
LDL Cholesterol: 136 mg/dL — ABNORMAL HIGH (ref 0–99)
NONHDL: 162.94
Triglycerides: 133 mg/dL (ref 0.0–149.0)
VLDL: 26.6 mg/dL (ref 0.0–40.0)

## 2016-12-02 LAB — PSA: PSA: 0.99 ng/mL (ref 0.10–4.00)

## 2016-12-02 NOTE — Assessment & Plan Note (Signed)
Well controlled. Given exercises. He will monitor.

## 2016-12-02 NOTE — Progress Notes (Signed)
Tommi Rumps, MD Phone: [phone]  Danny Navarro is a 61 y.o. male who presents today for a physical exam.  Patient exercises by riding his bike 4 times a week. Gets 150 miles in a week. Diet is described as pretty clean. Occasionally does cheat. Tetanus vaccination up-to-date. Colonoscopy up-to-date. Hepatitis C testing today. Patient declines HIV testing. No tobacco use, alcohol use, or illicit drug use. Patient's father had a history of multiple myeloma. Patient notes rare occasional low back discomfort. He follows with orthopedics for this. Had a cortisone injection sometime ago that was beneficial. Takes about 1 pill of ibuprofen daily. No numbness, weakness, loss of bowel or bladder function, or saddle anesthesia.  Active Ambulatory Problems    Diagnosis Date Noted  . Hyperlipidemia 06/04/2008  . HYPERTENSION, BENIGN 06/04/2008  . Low back pain 10/09/2015  . Routine general medical examination at a health care facility 12/02/2015  . Acute bronchitis 03/23/2016   Resolved Ambulatory Problems    Diagnosis Date Noted  . No Resolved Ambulatory Problems   Past Medical History:  Diagnosis Date  . Anxiety   . Hyperlipidemia   . Hypertension   . Screening 06/04/2008    Family History  Problem Relation Age of Onset  . Coronary artery disease Mother   . Kidney disease Mother   . Heart disease Mother   . Cancer Father   . Multiple myeloma Father     Social History   Social History  . Marital status: Married    Spouse name: N/A  . Number of children: N/A  . Years of education: N/A   Occupational History  . Full time     Owns his own business   Social History Main Topics  . Smoking status: Former Smoker    Quit date: 06/20/1978  . Smokeless tobacco: Never Used  . Alcohol use No  . Drug use: No  . Sexual activity: Not on file   Other Topics Concern  . Not on file   Social History Narrative   Married with 2 kids   Owns his own business   Gets regular  exercise    ROS  General:  Negative for nexplained weight loss, fever Skin: Negative for new or changing mole, sore that won't heal HEENT: Negative for trouble hearing, trouble seeing, ringing in ears, mouth sores, hoarseness, change in voice, dysphagia. CV:  Negative for chest pain, dyspnea, edema, palpitations Resp: Negative for cough, dyspnea, hemoptysis GI: Negative for nausea, vomiting, diarrhea, constipation, abdominal pain, melena, hematochezia. GU: Negative for dysuria, incontinence, urinary hesitance, hematuria, vaginal or penile discharge, polyuria, sexual difficulty, lumps in testicle or breasts MSK: Negative for muscle cramps or aches, joint pain or swelling Neuro: Negative for headaches, weakness, numbness, dizziness, passing out/fainting Psych: Negative for depression, anxiety, memory problems  Objective  Physical Exam Vitals:   12/02/16 0803  BP: 124/80  Pulse: 70  Temp: 97.9 F (36.6 C)    BP Readings from Last 3 Encounters:  12/02/16 124/80  06/02/16 138/80  04/12/16 99/79   Wt Readings from Last 3 Encounters:  12/02/16 189 lb (85.7 kg)  06/02/16 192 lb 12.8 oz (87.5 kg)  04/12/16 189 lb (85.7 kg)    Physical Exam  Constitutional: No distress.  HENT:  Head: Normocephalic and atraumatic.  Mouth/Throat: Oropharynx is clear and moist. No oropharyngeal exudate.  Eyes: Conjunctivae are normal. Pupils are equal, round, and reactive to light.  Cardiovascular: Normal rate, regular rhythm and normal heart sounds.   Pulmonary/Chest: Effort normal  and breath sounds normal.  Abdominal: Soft. Bowel sounds are normal. He exhibits no distension. There is no tenderness. There is no rebound and no guarding.  Musculoskeletal: He exhibits no edema.  No midline spine tenderness, no midline spine step-off, no muscular back tenderness  Neurological: He is alert. Gait normal.  5 out of 5 strength bilateral quads, hamstrings, plantar flexion, and dorsiflexion, sensation to  light touch intact bilaterally lower extremities  Skin: Skin is warm and dry. He is not diaphoretic.  Psychiatric: Mood and affect normal.     Assessment/Plan:   Routine general medical examination at a health care facility Physical exam completed. Patient is doing well overall. Blood pressure well controlled. He declines HIV testing. Other health maintenance up-to-date. Check lab work as outlined below. Follow-up in 6 months for blood pressure and one year for physical.  Low back pain Well controlled. Given exercises. He will monitor.   Orders Placed This Encounter  Procedures  . Comp Met (CMET)  . Lipid Profile  . PSA    No orders of the defined types were placed in this encounter.    Tommi Rumps, MD [place]

## 2016-12-02 NOTE — Assessment & Plan Note (Signed)
Physical exam completed. Patient is doing well overall. Blood pressure well controlled. He declines HIV testing. Other health maintenance up-to-date. Check lab work as outlined below. Follow-up in 6 months for blood pressure and one year for physical.

## 2016-12-02 NOTE — Patient Instructions (Addendum)
Nice to see you. Please continue to work on diet and exercise. We will get lab work today and contact you with the results.   Back Exercises The following exercises strengthen the muscles that help to support the back. They also help to keep the lower back flexible. Doing these exercises can help to prevent back pain or lessen existing pain. If you have back pain or discomfort, try doing these exercises 2-3 times each day or as told by your health care provider. When the pain goes away, do them once each day, but increase the number of times that you repeat the steps for each exercise (do more repetitions). If you do not have back pain or discomfort, do these exercises once each day or as told by your health care provider. Exercises Single Knee to Chest  Repeat these steps 3-5 times for each leg: 1. Lie on your back on a firm bed or the floor with your legs extended. 2. Bring one knee to your chest. Your other leg should stay extended and in contact with the floor. 3. Hold your knee in place by grabbing your knee or thigh. 4. Pull on your knee until you feel a gentle stretch in your lower back. 5. Hold the stretch for 10-30 seconds. 6. Slowly release and straighten your leg.  Pelvic Tilt  Repeat these steps 5-10 times: 1. Lie on your back on a firm bed or the floor with your legs extended. 2. Bend your knees so they are pointing toward the ceiling and your feet are flat on the floor. 3. Tighten your lower abdominal muscles to press your lower back against the floor. This motion will tilt your pelvis so your tailbone points up toward the ceiling instead of pointing to your feet or the floor. 4. With gentle tension and even breathing, hold this position for 5-10 seconds.  Cat-Cow  Repeat these steps until your lower back becomes more flexible: 1. Get into a hands-and-knees position on a firm surface. Keep your hands under your shoulders, and keep your knees under your hips. You may place  padding under your knees for comfort. 2. Let your head hang down, and point your tailbone toward the floor so your lower back becomes rounded like the back of a cat. 3. Hold this position for 5 seconds. 4. Slowly lift your head and point your tailbone up toward the ceiling so your back forms a sagging arch like the back of a cow. 5. Hold this position for 5 seconds.  Press-Ups  Repeat these steps 5-10 times: 1. Lie on your abdomen (face-down) on the floor. 2. Place your palms near your head, about shoulder-width apart. 3. While you keep your back as relaxed as possible and keep your hips on the floor, slowly straighten your arms to raise the top half of your body and lift your shoulders. Do not use your back muscles to raise your upper torso. You may adjust the placement of your hands to make yourself more comfortable. 4. Hold this position for 5 seconds while you keep your back relaxed. 5. Slowly return to lying flat on the floor.  Bridges  Repeat these steps 10 times: 1. Lie on your back on a firm surface. 2. Bend your knees so they are pointing toward the ceiling and your feet are flat on the floor. 3. Tighten your buttocks muscles and lift your buttocks off of the floor until your waist is at almost the same height as your knees. You should feel  the muscles working in your buttocks and the back of your thighs. If you do not feel these muscles, slide your feet 1-2 inches farther away from your buttocks. 4. Hold this position for 3-5 seconds. 5. Slowly lower your hips to the starting position, and allow your buttocks muscles to relax completely.  If this exercise is too easy, try doing it with your arms crossed over your chest. Abdominal Crunches  Repeat these steps 5-10 times: 1. Lie on your back on a firm bed or the floor with your legs extended. 2. Bend your knees so they are pointing toward the ceiling and your feet are flat on the floor. 3. Cross your arms over your  chest. 4. Tip your chin slightly toward your chest without bending your neck. 5. Tighten your abdominal muscles and slowly raise your trunk (torso) high enough to lift your shoulder blades a tiny bit off of the floor. Avoid raising your torso higher than that, because it can put too much stress on your low back and it does not help to strengthen your abdominal muscles. 6. Slowly return to your starting position.  Back Lifts Repeat these steps 5-10 times: 1. Lie on your abdomen (face-down) with your arms at your sides, and rest your forehead on the floor. 2. Tighten the muscles in your legs and your buttocks. 3. Slowly lift your chest off of the floor while you keep your hips pressed to the floor. Keep the back of your head in line with the curve in your back. Your eyes should be looking at the floor. 4. Hold this position for 3-5 seconds. 5. Slowly return to your starting position.  Contact a health care provider if:  Your back pain or discomfort gets much worse when you do an exercise.  Your back pain or discomfort does not lessen within 2 hours after you exercise. If you have any of these problems, stop doing these exercises right away. Do not do them again unless your health care provider says that you can. Get help right away if:  You develop sudden, severe back pain. If this happens, stop doing the exercises right away. Do not do them again unless your health care provider says that you can. This information is not intended to replace advice given to you by your health care provider. Make sure you discuss any questions you have with your health care provider. Document Released: 07/14/2004 Document Revised: 10/14/2015 Document Reviewed: 07/31/2014 Elsevier Interactive Patient Education  2017 [employer].

## 2017-01-13 ENCOUNTER — Other Ambulatory Visit: Payer: Self-pay | Admitting: Family Medicine

## 2017-04-03 DIAGNOSIS — R002 Palpitations: Secondary | ICD-10-CM | POA: Insufficient documentation

## 2017-05-23 ENCOUNTER — Other Ambulatory Visit: Payer: Self-pay | Admitting: Family Medicine

## 2017-06-06 ENCOUNTER — Ambulatory Visit: Payer: No Typology Code available for payment source | Admitting: Family Medicine

## 2017-06-24 NOTE — Progress Notes (Signed)
Cardiology Office Note  Date:  06/26/2017   ID:  Danny Navarro, DOB 1956/06/05, MRN [id number]  PCP:  Leone Haven, MD   Chief Complaint  Patient presents with  . OTHER    Former Nehemiah Massed pt hx heart palpitations no complaints today. Meds reviewed verbally with pt.    HPI:  Danny Navarro is a 62 year old gentleman with past medical history of Hyperlipidemia PVCs Hypertension Who presents for new patient evaluation for his palpitations, PVCs, cardiac risk factors  Recent studies reviewed with him in detail done at outside office Holter monitor showing occasional PVCs, sinus tachycardia, bradycardia Normal stress test  LDL 136, HDL 43, total cholesterol 206  He had stress test, routine treadmill study that showed no EKG changes concerning for ischemia  achieved 16 METS  Denies any chest pain on exertion, active biker good aerobic tolerance Coffee drinker, stress at work which he attributes to his ectopy Appreciates the ectopy but does not want medications Ectopy seems to come and go  Mom was smoker, CAD Dad died of cancer  EKG personally reviewed by myself on todays visit Shows normal sinus rhythm with no significant ST or T wave changes rate 83 bpm   PMH:   has a past medical history of Anxiety, Hyperlipidemia, Hypertension, and Screening (06/04/2008).  PSH:    Past Surgical History:  Procedure Laterality Date  . [place]   x2    Current Outpatient Medications  Medication Sig Dispense Refill  . Coenzyme Q10 10 MG capsule Take by mouth.    Marland Kitchen ibuprofen (ADVIL,MOTRIN) 800 MG tablet TAKE 1 TABLET BY MOUTH THREE TIMES DAILY AS NEEDED WITH FOOD  2  . Multiple Vitamins-Minerals (CENTURY) TABS Take 1 tablet by mouth daily.      . ramipril (ALTACE) 5 MG capsule TAKE 1 CAPSULE BY MOUTH EVERY DAY 30 capsule 3  . Red Yeast Rice 600 MG CAPS Take 2 capsules by mouth at bedtime.     . vitamin C (ASCORBIC ACID) 500 MG tablet Take 500 mg by mouth daily.     Current  Facility-Administered Medications  Medication Dose Route Frequency Provider Last Rate Last Dose  . 0.9 %  sodium chloride infusion  500 mL Intravenous Continuous Armbruster, Carlota Raspberry, MD         Allergies:   Declomycin [demeclocycline]   Social History:  The patient  reports that he quit smoking about 39 years ago. he has never used smokeless tobacco. He reports that he does not drink alcohol or use drugs.   Family History:   family history includes Cancer in his father; Coronary artery disease in his mother; Heart attack in his mother; Heart disease in his mother; Kidney disease in his mother; Multiple myeloma in his father.    Review of Systems: Review of Systems  Constitutional: Negative.   Respiratory: Negative.   Cardiovascular: Positive for palpitations.  Gastrointestinal: Negative.   Musculoskeletal: Negative.   Neurological: Negative.   Psychiatric/Behavioral: Negative.   All other systems reviewed and are negative.    PHYSICAL EXAM: VS:  BP 140/64 (BP Location: Right Arm, Patient Position: Sitting, Cuff Size: Normal)   Pulse 83   Ht 6' (1.829 m)   Wt 189 lb (85.7 kg)   BMI 25.63 kg/m  , BMI Body mass index is 25.63 kg/m. GEN: Well nourished, well developed, in no acute distress  HEENT: normal  Neck: no JVD, carotid bruits, or masses Cardiac: RRR; no murmurs, rubs, or gallops,no edema  Respiratory:  clear to auscultation bilaterally, normal work of breathing GI: soft, nontender, nondistended, + BS MS: no deformity or atrophy  Skin: warm and dry, no rash Neuro:  Strength and sensation are intact Psych: euthymic mood, full affect    Recent Labs: 12/02/2016: ALT 19; BUN 19; Creatinine, Ser 0.74; Potassium 4.4; Sodium 138    Lipid Panel Lab Results  Component Value Date   CHOL 206 (H) 12/02/2016   HDL 43.20 12/02/2016   LDLCALC 136 (H) 12/02/2016   TRIG 133.0 12/02/2016      Wt Readings from Last 3 Encounters:  06/26/17 189 lb (85.7 kg)  12/02/16 189  lb (85.7 kg)  06/02/16 192 lb 12.8 oz (87.5 kg)       ASSESSMENT AND PLAN:  Palpitations - Plan: EKG 12-Lead Secondary to PVCs as detailed below Not very symptomatic  PVC's (premature ventricular contractions) Occasional PVCs reported on Holter monitor He does not want medications If symptoms get worse we could use propranolol or metoprolol.  He is declined at this time Trying to cut back on his coffee Also likely exacerbated by stress  Family history of coronary artery disease Mother was a long-term smoker He does not smoke since his 3s Few other risk factors for coronary disease, mildly elevated cholesterol  HYPERTENSION, BENIGN Blood pressure is well controlled on today's visit. No changes made to the medications. On low-dose ramipril  Mixed hyperlipidemia He does not want a statin, prefers to take red yeast rice 2/day  Anxiety Anxious on today's visit, Reports that his white coat  Disposition:   F/U as needed   Total encounter time more than 45 minutes  Greater than 50% was spent in counseling and coordination of care with the patient    Orders Placed This Encounter  Procedures  . EKG 12-Lead     Signed, Esmond Plants, M.D., Ph.D. 06/26/2017  [place], [employer]

## 2017-06-26 ENCOUNTER — Encounter: Payer: Self-pay | Admitting: Cardiovascular Disease

## 2017-06-26 ENCOUNTER — Ambulatory Visit (INDEPENDENT_AMBULATORY_CARE_PROVIDER_SITE_OTHER): Payer: Self-pay | Admitting: Cardiovascular Disease

## 2017-06-26 VITALS — BP 140/64 | HR 83 | Ht 72.0 in | Wt 189.0 lb

## 2017-06-26 DIAGNOSIS — F419 Anxiety disorder, unspecified: Secondary | ICD-10-CM | POA: Insufficient documentation

## 2017-06-26 DIAGNOSIS — I10 Essential (primary) hypertension: Secondary | ICD-10-CM

## 2017-06-26 DIAGNOSIS — R002 Palpitations: Secondary | ICD-10-CM

## 2017-06-26 DIAGNOSIS — I493 Ventricular premature depolarization: Secondary | ICD-10-CM | POA: Insufficient documentation

## 2017-06-26 DIAGNOSIS — E782 Mixed hyperlipidemia: Secondary | ICD-10-CM

## 2017-06-26 DIAGNOSIS — Z8249 Family history of ischemic heart disease and other diseases of the circulatory system: Secondary | ICD-10-CM | POA: Insufficient documentation

## 2017-06-26 NOTE — Patient Instructions (Signed)
Medication Instructions:   No medication changes made  Labwork:  No new labs needed  Testing/Procedures:  No further testing at this time  Call if you ever want a coronary calcium score  $150   Follow-Up: It was a pleasure seeing you in the office today. Please call us if you have new issues that need to be addressed before your next appt.  [phone]  Your physician wants you to follow-up in:  As needed  If you need a refill on your cardiac medications before your next appointment, please call your pharmacy.

## 2017-08-24 ENCOUNTER — Ambulatory Visit: Payer: No Typology Code available for payment source | Admitting: Family Medicine

## 2017-09-29 ENCOUNTER — Encounter: Payer: Self-pay | Admitting: Family Medicine

## 2017-09-29 ENCOUNTER — Ambulatory Visit (INDEPENDENT_AMBULATORY_CARE_PROVIDER_SITE_OTHER): Payer: Self-pay | Admitting: Family Medicine

## 2017-09-29 VITALS — BP 120/70 | HR 71 | Temp 97.9°F | Resp 17 | Ht 72.0 in | Wt 190.0 lb

## 2017-09-29 DIAGNOSIS — I10 Essential (primary) hypertension: Secondary | ICD-10-CM

## 2017-09-29 DIAGNOSIS — J309 Allergic rhinitis, unspecified: Secondary | ICD-10-CM | POA: Insufficient documentation

## 2017-09-29 DIAGNOSIS — I493 Ventricular premature depolarization: Secondary | ICD-10-CM

## 2017-09-29 DIAGNOSIS — E782 Mixed hyperlipidemia: Secondary | ICD-10-CM

## 2017-09-29 LAB — BASIC METABOLIC PANEL
BUN: 13 mg/dL (ref 6–23)
CALCIUM: 9.1 mg/dL (ref 8.4–10.5)
CHLORIDE: 101 meq/L (ref 96–112)
CO2: 29 meq/L (ref 19–32)
Creatinine, Ser: 0.65 mg/dL (ref 0.40–1.50)
GFR: 132.01 mL/min (ref >60.00)
Glucose, Bld: 104 mg/dL — ABNORMAL HIGH (ref 70–99)
Potassium: 4.4 mEq/L (ref 3.5–5.1)
SODIUM: 138 meq/L (ref 135–145)

## 2017-09-29 NOTE — Assessment & Plan Note (Signed)
Relatively well controlled.  He will continue red yeast rice.  Plan to recheck at physical exam later this year.

## 2017-09-29 NOTE — Assessment & Plan Note (Signed)
He can continue over-the-counter Allegra.

## 2017-09-29 NOTE — Patient Instructions (Addendum)
Nice to see you. Please continue to stay active. You can continue Allegra for your allergy symptoms. If you notice more persistent palpitations please let us know.

## 2017-09-29 NOTE — Assessment & Plan Note (Signed)
Asymptomatic.  He will monitor for recurrence. 

## 2017-09-29 NOTE — Progress Notes (Signed)
  Marikay AlarEric Khiara Shuping, MD Phone: [phone]  Lavina HammanMark K Joanne GavelSutton is a 62 y.o. male who presents today for f/u.  HYPERTENSION  Disease Monitoring  Home BP Monitoring not checking Chest pain- no    Dyspnea- no Medications  Compliance-  Taking ramipril.  Edema- no  HYPERLIPIDEMIA Symptoms Chest pain on exertion:  no   Leg claudication:   no Medications: Compliance- taking red yeast rice Right upper quadrant pain- no  Muscle aches- no  PVCs: [facility] Mariah MillingGollan.  Negative workup.  Reportedly normal stress test and Holter monitor with occasional PVCs.  No persistence of the symptoms.  No palpitations.  Allergic rhinitis: Notes his eyes water in his nose runs with the pollen.  He takes Careers adviserAllegra and that is beneficial.    Social History   Tobacco Use  Smoking Status Former Smoker  . Last attempt to quit: 06/20/1978  . Years since quitting: 39.3  Smokeless Tobacco Never Used     ROS see history of present illness  Objective  Physical Exam Vitals:   09/29/17 0923  BP: 120/70  Pulse: 71  Resp: 17  Temp: 97.9 F (36.6 C)  SpO2: 96%    BP Readings from Last 3 Encounters:  09/29/17 120/70  06/26/17 140/64  12/02/16 124/80   Wt Readings from Last 3 Encounters:  09/29/17 190 lb (86.2 kg)  06/26/17 189 lb (85.7 kg)  12/02/16 189 lb (85.7 kg)    Physical Exam  Constitutional: No distress.  Cardiovascular: Normal rate, regular rhythm and normal heart sounds.  Pulmonary/Chest: Effort normal and breath sounds normal.  Musculoskeletal: He exhibits no edema.  Neurological: He is alert.  Skin: Skin is warm and dry. He is not diaphoretic.     Assessment/Plan: Please see individual problem list.  PVC's (premature ventricular contractions) Asymptomatic.  He will monitor for recurrence.  HYPERTENSION, BENIGN Well-controlled.  Continue ramipril.  Check BMP.  Hyperlipidemia Relatively well controlled.  He will continue red yeast rice.  Plan to recheck at  physical exam later this year.  Allergic rhinitis He can continue over-the-counter Allegra.   Orders Placed This Encounter  Procedures  . Basic Metabolic Panel (BMET)    No orders of the defined types were placed in this encounter.    Marikay AlarEric Keddrick Wyne, MD [facility]

## 2017-09-29 NOTE — Assessment & Plan Note (Signed)
Well-controlled.  Continue ramipril.  Check BMP.

## 2017-10-20 ENCOUNTER — Other Ambulatory Visit: Payer: Self-pay | Admitting: Family Medicine

## 2018-01-08 ENCOUNTER — Other Ambulatory Visit: Payer: Self-pay | Admitting: Family Medicine

## 2018-01-09 NOTE — Telephone Encounter (Signed)
Last OV 09/29/17 I do not see this on his chart

## 2018-01-09 NOTE — Telephone Encounter (Signed)
It appears this came from orthopedics at [facility].  He should request the refill through them.

## 2018-01-10 NOTE — Telephone Encounter (Signed)
Left message to return call, ok for pec to speak to patient and inform him that he will need to contact orthopedics for a refill of meloxicam

## 2018-01-13 ENCOUNTER — Other Ambulatory Visit: Payer: Self-pay | Admitting: Family Medicine

## 2018-04-02 ENCOUNTER — Ambulatory Visit (INDEPENDENT_AMBULATORY_CARE_PROVIDER_SITE_OTHER): Payer: No Typology Code available for payment source | Admitting: Family Medicine

## 2018-04-02 ENCOUNTER — Encounter: Payer: Self-pay | Admitting: Family Medicine

## 2018-04-02 VITALS — BP 116/80 | HR 93 | Temp 97.9°F | Ht 72.75 in | Wt 186.0 lb

## 2018-04-02 DIAGNOSIS — I10 Essential (primary) hypertension: Secondary | ICD-10-CM | POA: Diagnosis not present

## 2018-04-02 DIAGNOSIS — Z0000 Encounter for general adult medical examination without abnormal findings: Secondary | ICD-10-CM

## 2018-04-02 DIAGNOSIS — Z125 Encounter for screening for malignant neoplasm of prostate: Secondary | ICD-10-CM | POA: Diagnosis not present

## 2018-04-02 DIAGNOSIS — E785 Hyperlipidemia, unspecified: Secondary | ICD-10-CM | POA: Diagnosis not present

## 2018-04-02 LAB — CBC
HEMATOCRIT: 44.7 % (ref 39.0–52.0)
HEMOGLOBIN: 15.3 g/dL (ref 13.0–17.0)
MCHC: 34.2 g/dL (ref 30.0–36.0)
MCV: 91.1 fl (ref 78.0–100.0)
Platelets: 271 10*3/uL (ref 150.0–400.0)
RBC: 4.9 Mil/uL (ref 4.22–5.81)
RDW: 13.3 % (ref 11.5–15.5)
WBC: 4.7 10*3/uL (ref 4.0–10.5)

## 2018-04-02 LAB — LIPID PANEL
CHOLESTEROL: 224 mg/dL — AB (ref 0–200)
HDL: 44.8 mg/dL (ref >39.00)
LDL Cholesterol: 147 mg/dL — ABNORMAL HIGH (ref 0–99)
NonHDL: 178.76
Total CHOL/HDL Ratio: 5
Triglycerides: 159 mg/dL — ABNORMAL HIGH (ref 0.0–149.0)
VLDL: 31.8 mg/dL (ref 0.0–40.0)

## 2018-04-02 LAB — COMPREHENSIVE METABOLIC PANEL
ALBUMIN: 4.4 g/dL (ref 3.5–5.2)
ALT: 19 U/L (ref 0–53)
AST: 14 U/L (ref 0–37)
Alkaline Phosphatase: 67 U/L (ref 39–117)
BILIRUBIN TOTAL: 0.4 mg/dL (ref 0.2–1.2)
BUN: 21 mg/dL (ref 6–23)
CALCIUM: 9.4 mg/dL (ref 8.4–10.5)
CHLORIDE: 102 meq/L (ref 96–112)
CO2: 30 mEq/L (ref 19–32)
Creatinine, Ser: 0.69 mg/dL (ref 0.40–1.50)
GFR: 123.01 mL/min (ref >60.00)
Glucose, Bld: 105 mg/dL — ABNORMAL HIGH (ref 70–99)
Potassium: 4.6 mEq/L (ref 3.5–5.1)
Sodium: 136 mEq/L (ref 135–145)
Total Protein: 6.6 g/dL (ref 6.0–8.3)

## 2018-04-02 LAB — TSH: TSH: 1.31 u[IU]/mL (ref 0.35–4.50)

## 2018-04-02 LAB — PSA: PSA: 0.82 ng/mL (ref 0.10–4.00)

## 2018-04-02 NOTE — Assessment & Plan Note (Signed)
Physical exam completed.  He will continue diet and exercise.  He will think about Shingrix.  Lab work as outlined below.

## 2018-04-02 NOTE — Progress Notes (Signed)
Tommi Rumps, MD Phone: [phone]  Danny Navarro is a 62 y.o. male who presents today for cpe.  Exercises by cycling.  He is very active. Eats normal healthy diet. Colonoscopy up-to-date 04/12/2016 with 10-year recall. Due for prostate cancer screening. Up-to-date on tetanus vaccine.  He declines flu vaccination.  He will think about Shingrix. He declines HIV screening.  Hepatitis C screening up-to-date. No tobacco use, alcohol use corn or illicit drug use. He sees an ophthalmologist once yearly.  Sees a dentist twice yearly.  Active Ambulatory Problems    Diagnosis Date Noted  . Hyperlipidemia 06/04/2008  . HYPERTENSION, BENIGN 06/04/2008  . Low back pain 10/09/2015  . Routine general medical examination at a health care facility 12/02/2015  . PVC's (premature ventricular contractions) 06/26/2017  . Family history of coronary artery disease 06/26/2017  . Anxiety 06/26/2017  . Allergic rhinitis 09/29/2017   Resolved Ambulatory Problems    Diagnosis Date Noted  . Acute bronchitis 03/23/2016   Past Medical History:  Diagnosis Date  . Hypertension   . Screening 06/04/2008    Family History  Problem Relation Age of Onset  . Coronary artery disease Mother   . Kidney disease Mother   . Heart disease Mother   . Heart attack Mother   . Cancer Father   . Multiple myeloma Father     Social History   Socioeconomic History  . Marital status: Married    Spouse name: Not on file  . Number of children: Not on file  . Years of education: Not on file  . Highest education level: Not on file  Occupational History  . 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 Financial resource strain: Not on file  . Food insecurity:    Worry: Not on file    Inability: Not on file  . Transportation needs:    Medical: Not on file    Non-medical: Not on file  Tobacco Use  . Smoking status: Former Smoker    Last attempt to quit: 06/20/1978    Years since  quitting: 39.8  . Smokeless tobacco: Never Used  Substance and Sexual Activity  . Alcohol use: No  . Drug use: No  . Sexual activity: Not on file  Lifestyle  . Physical activity:    Days per week: Not on file    Minutes per session: Not on file  . Stress: Not on file  Relationships  . Social connections:    Talks on phone: Not on file    Gets together: Not on file    Attends religious service: Not on file    Active member of club or organization: Not on file    Attends meetings of clubs or organizations: Not on file    Relationship status: Not on file  . Intimate partner violence:    Fear of current or ex partner: Not on file    Emotionally abused: Not on file    Physically abused: Not on file    Forced sexual activity: Not on file  Other Topics Concern  . Not on file  Social History Narrative   Married with 2 kids   Owns his own business   Gets regular exercise    ROS  General:  Negative for nexplained weight loss, fever Skin: Negative for new or changing mole, sore that won't heal HEENT: Negative for trouble hearing, trouble seeing, ringing in ears, mouth sores, hoarseness, change in voice, dysphagia. CV:  Negative for chest pain, dyspnea, edema, palpitations Resp: Negative for cough, dyspnea, hemoptysis GI: Negative for nausea, vomiting, diarrhea, constipation, abdominal pain, melena, hematochezia. GU: Negative for dysuria, incontinence, urinary hesitance, hematuria, vaginal or penile discharge, polyuria, sexual difficulty, lumps in testicle or breasts MSK: Negative for muscle cramps or aches, joint pain or swelling Neuro: Negative for headaches, weakness, numbness, dizziness, passing out/fainting Psych: Negative for depression, anxiety, memory problems  Objective  Physical Exam Vitals:   04/02/18 0824  BP: 116/80  Pulse: 93  Temp: 97.9 F (36.6 C)  SpO2: 99%    BP Readings from Last 3 Encounters:  04/02/18 116/80  09/29/17 120/70  06/26/17 140/64   Wt  Readings from Last 3 Encounters:  04/02/18 186 lb (84.4 kg)  09/29/17 190 lb (86.2 kg)  06/26/17 189 lb (85.7 kg)    Physical Exam  Constitutional: No distress.  HENT:  Head: Normocephalic and atraumatic.  Mouth/Throat: Oropharynx is clear and moist.  Eyes: Pupils are equal, round, and reactive to light. Conjunctivae are normal.  Neck: Neck supple.  Cardiovascular: Normal rate, regular rhythm and normal heart sounds.  Pulmonary/Chest: Effort normal and breath sounds normal.  Abdominal: Soft. Bowel sounds are normal. He exhibits no distension. There is no tenderness. There is no rebound and no guarding.  Musculoskeletal: He exhibits no edema.  Lymphadenopathy:    He has no cervical adenopathy.  Neurological: He is alert.  Skin: Skin is warm and dry. He is not diaphoretic.  Psychiatric: He has a normal mood and affect.     Assessment/Plan:   Routine general medical examination at a health care facility Physical exam completed.  He will continue diet and exercise.  He will think about Shingrix.  Lab work as outlined below.   Orders Placed This Encounter  Procedures  . Comp Met (CMET)  . PSA  . Lipid panel  . CBC  . TSH    No orders of the defined types were placed in this encounter.    Tommi Rumps, MD [place]

## 2018-04-02 NOTE — Patient Instructions (Signed)
Nice to see you. We will get lab work today and contact you with the results. Please look into getting Shingrix and if you would like this please let us know. Please continue to stay active and watch your diet.

## 2018-04-05 ENCOUNTER — Telehealth: Payer: Self-pay | Admitting: Family Medicine

## 2018-04-05 NOTE — Telephone Encounter (Signed)
Pt called back.  Copied from CRM [id number]. Topic: Quick Communication - Lab Results (Clinic Use ONLY) >> Apr 03, 2018  2:18 PM Gracelyn Nurse, [place] wrote: Called patient to inform them of  lab results. When patient returns call, triage nurse may disclose results.

## 2018-04-05 NOTE — Telephone Encounter (Signed)
Results given and documented in result note. 

## 2018-05-20 NOTE — Progress Notes (Signed)
Cardiology Office Note  Date:  05/21/2018   ID:  PACER DORN, DOB 14-Apr-1956, MRN [id number]  PCP:  Leone Haven, MD   Chief Complaint  Patient presents with  . other    Follow up on the cholesterol. Meds reviewed by the pt. verbally. "doing well."     HPI:  [name] is a 62 year old gentleman with past medical history of Hyperlipidemia PVCs Hypertension Who presents for new patient evaluation for his palpitations, PVCs, cardiac risk factors  Reports that he feels well but very anxious about his cholesterol numbers and health Denies any chest pain or shortness of breath on exertion Weight is stable, trying to eat the right things, Biker, good aerobic tolerance Previously with extra beats, ectopy  Previous studies reviewed with him Holter monitor showing occasional PVCs, sinus tachycardia, bradycardia Normal stress test  Lab work reviewed with him total cholesterol 220 LDL 140  Discussed family history with him again Mom was smoker, CAD Dad died of cancer  EKG personally reviewed by myself on todays visit Shows normal sinus rhythm with no significant ST or T wave changes rate 100 bpm  Blood pressure at home typically 120   PMH:   has a past medical history of Anxiety, Hyperlipidemia, Hypertension, and Screening (06/04/2008).  PSH:    Past Surgical History:  Procedure Laterality Date  . [place]   x2    Current Outpatient Medications  Medication Sig Dispense Refill  . Coenzyme Q10 10 MG capsule Take by mouth.    Marland Kitchen ibuprofen (ADVIL,MOTRIN) 800 MG tablet TAKE 1 TABLET BY MOUTH THREE TIMES DAILY AS NEEDED WITH FOOD  2  . Multiple Vitamins-Minerals (CENTURY) TABS Take 1 tablet by mouth daily.      . ramipril (ALTACE) 5 MG capsule TAKE 1 CAPSULE BY MOUTH ONCE DAILY 30 capsule 3  . Red Yeast Rice 600 MG CAPS Take 2 capsules by mouth at bedtime.     . vitamin C (ASCORBIC ACID) 500 MG tablet Take 500 mg by mouth daily.     Current  Facility-Administered Medications  Medication Dose Route Frequency Provider Last Rate Last Dose  . 0.9 %  sodium chloride infusion  500 mL Intravenous Continuous Armbruster, Carlota Raspberry, MD         Allergies:   Other and Declomycin [demeclocycline]   Social History:  The patient  reports that he quit smoking about 39 years ago. He has never used smokeless tobacco. He reports that he does not drink alcohol or use drugs.   Family History:   family history includes Cancer in his father; Coronary artery disease in his mother; Heart attack in his mother; Heart disease in his mother; Kidney disease in his mother; Multiple myeloma in his father.    Review of Systems: Review of Systems  Constitutional: Negative.   Respiratory: Negative.   Cardiovascular: Positive for palpitations.  Gastrointestinal: Negative.   Musculoskeletal: Negative.   Neurological: Negative.   Psychiatric/Behavioral: Negative.   All other systems reviewed and are negative.    PHYSICAL EXAM: VS:  BP (!) 138/98 (BP Location: Left Arm, Patient Position: Sitting, Cuff Size: Normal)   Pulse 100   Ht 6' (1.829 m)   Wt 188 lb 12 oz (85.6 kg)   BMI 25.60 kg/m  , BMI Body mass index is 25.6 kg/m. Constitutional:  oriented to person, place, and time. No distress.  HENT:  Head: Grossly normal Eyes:  no discharge. No scleral icterus.  Neck: No JVD, no  carotid bruits  Cardiovascular: Regular rate and rhythm, no murmurs appreciated Pulmonary/Chest: Clear to auscultation bilaterally, no wheezes or rails Abdominal: Soft.  no distension.  no tenderness.  Musculoskeletal: Normal range of motion Neurological:  normal muscle tone. Coordination normal. No atrophy Skin: Skin warm and dry Psychiatric: normal affect, pleasant   Recent Labs: 04/02/2018: ALT 19; BUN 21; Creatinine, Ser 0.69; Hemoglobin 15.3; Platelets 271.0; Potassium 4.6; Sodium 136; TSH 1.31    Lipid Panel Lab Results  Component Value Date   CHOL 224 (H)  04/02/2018   HDL 44.80 04/02/2018   LDLCALC 147 (H) 04/02/2018   TRIG 159.0 (H) 04/02/2018      Wt Readings from Last 3 Encounters:  05/21/18 188 lb 12 oz (85.6 kg)  04/02/18 186 lb (84.4 kg)  09/29/17 190 lb (86.2 kg)       ASSESSMENT AND PLAN:  Palpitations - Plan: EKG 12-Lead Secondary to PVCs  Denies having significant symptoms, no further work-up needed  Hyperlipidemia Does not want a statin at this time, he is taking red yeast rice We did discuss Zetia and its mechanism Discussed other screening studies such as CT coronary calcium scoring Order has been placed for calcium score, he will do this when his schedule permits  Family history of coronary artery disease Mother was a long-term smoker Had cardiac disease  HYPERTENSION, BENIGN Elevated blood pressure today but well controlled at home, he is very anxious today  Anxiety Anxious on today's visit, as on last clinic visit Recommended he monitor blood pressure  Disposition:   F/U as needed   Total encounter time more than 25 minutes  Greater than 50% was spent in counseling and coordination of care with the patient    No orders of the defined types were placed in this encounter.    Signed, Esmond Plants, M.D., Ph.D. 05/21/2018  [place], [place]

## 2018-05-21 ENCOUNTER — Encounter: Payer: Self-pay | Admitting: Cardiovascular Disease

## 2018-05-21 ENCOUNTER — Ambulatory Visit (INDEPENDENT_AMBULATORY_CARE_PROVIDER_SITE_OTHER): Payer: No Typology Code available for payment source | Admitting: Cardiovascular Disease

## 2018-05-21 VITALS — BP 138/98 | HR 100 | Ht 72.0 in | Wt 188.8 lb

## 2018-05-21 DIAGNOSIS — E782 Mixed hyperlipidemia: Secondary | ICD-10-CM | POA: Diagnosis not present

## 2018-05-21 DIAGNOSIS — R002 Palpitations: Secondary | ICD-10-CM

## 2018-05-21 DIAGNOSIS — I493 Ventricular premature depolarization: Secondary | ICD-10-CM

## 2018-05-21 DIAGNOSIS — I10 Essential (primary) hypertension: Secondary | ICD-10-CM | POA: Diagnosis not present

## 2018-05-21 DIAGNOSIS — F419 Anxiety disorder, unspecified: Secondary | ICD-10-CM

## 2018-05-21 DIAGNOSIS — Z9189 Other specified personal risk factors, not elsewhere classified: Secondary | ICD-10-CM

## 2018-05-21 NOTE — Addendum Note (Signed)
Addended by: Stann MainlandLARK, JENNIFER O on: 05/21/2018 09:22 AM   Modules accepted: Orders

## 2018-05-21 NOTE — Patient Instructions (Signed)
Medication Instructions:  No changes  If you need a refill on your cardiac medications before your next appointment, please call your pharmacy.    Lab work: No new labs needed   If you have labs (blood work) drawn today and your tests are completely normal, you will receive your results only by: Marland Kitchen. MyChart Message (if you have MyChart) OR . A paper copy in the mail If you have any lab test that is abnormal or we need to change your treatment, we will call you to review the results.   Testing/Procedures: No new testing needed  Think about the CT coronary calcium score $150, in [place]    Follow-Up: At [facility], you and your health needs are our priority.  As part of our continuing mission to provide you with exceptional heart care, we have created designated Provider Care Teams.  These Care Teams include your primary Cardiologist (physician) and Advanced Practice Providers (APPs -  Physician Assistants and Nurse Practitioners) who all work together to provide you with the care you need, when you need it.  . You will need a follow up appointment as needed .   Please call our office 2 months in advance to schedule this appointment.    . Providers on your designated Care Team:   . Nicolasa Duckinghristopher Berge, NP . Eula Listenyan Dunn, PA-C . Marisue IvanJacquelyn Visser, PA-C  Any Other Special Instructions Will Be Listed Below (If Applicable).  For educational health videos Log in to : www.myemmi.com Or : [email or web address], password : [email or web address]

## 2018-06-06 ENCOUNTER — Ambulatory Visit (INDEPENDENT_AMBULATORY_CARE_PROVIDER_SITE_OTHER)
Admission: RE | Admit: 2018-06-06 | Discharge: 2018-06-06 | Disposition: A | Discharge status: Home / Self Care | Payer: Self-pay | Source: Ambulatory Visit | Attending: Cardiovascular Disease | Admitting: Cardiovascular Disease

## 2018-06-06 DIAGNOSIS — Z9189 Other specified personal risk factors, not elsewhere classified: Secondary | ICD-10-CM

## 2018-06-11 ENCOUNTER — Telehealth: Payer: Self-pay | Admitting: *Deleted

## 2018-06-11 MED ORDER — EZETIMIBE 10 MG PO TABS: 10.0000 mg | ORAL_TABLET | Freq: Every day | ORAL | 3 refills | Status: DC

## 2018-06-11 NOTE — Telephone Encounter (Signed)
Results called to pt. Pt verbalized understanding. He is agreeable to start Zetia. He is hesitant on the Crestor because he says he had bad aches and pains in the past while taking that.  Rx for zetia sent to pharmacy.

## 2018-06-11 NOTE — Telephone Encounter (Signed)
-----   Message from Antonieta Ibaimothy J Gollan, MD sent at 06/10/2018 12:11 PM EST ----- CT coronary calcium score Mildly elevated 239 No further testing needed but would recommend aggressive management of cholesterol Could start with Zetia 10 mg daily This should give 20% drop but but ideally need to get LDL less than 70 To get numbers down will likely require low-dose statin such as Crestor 10 mg daily with Zetia  There is mildly dilated a sending aorta This can be monitored with annual echo or CT Typically we do not see rapid progression requiring intervention

## 2018-06-23 ENCOUNTER — Other Ambulatory Visit: Payer: Self-pay | Admitting: Family Medicine

## 2018-07-04 ENCOUNTER — Ambulatory Visit: Payer: No Typology Code available for payment source | Admitting: Family

## 2018-07-04 ENCOUNTER — Encounter: Payer: Self-pay | Admitting: Family

## 2018-07-04 VITALS — BP 132/80 | HR 92 | Temp 97.7°F | Wt 186.8 lb

## 2018-07-04 DIAGNOSIS — J0100 Acute maxillary sinusitis, unspecified: Secondary | ICD-10-CM | POA: Diagnosis not present

## 2018-07-04 MED ORDER — AMOXICILLIN-POT CLAVULANATE 875-125 MG PO TABS: 1.0000 | ORAL_TABLET | Freq: Two times a day (BID) | ORAL | 0 refills | Status: AC

## 2018-07-04 NOTE — Patient Instructions (Signed)
Start augmentin  Ensure to take probiotics while on antibiotics and also for 2 weeks after completion. It is important to re-colonize the gut with good bacteria and also to prevent any diarrheal infections associated with antibiotic use.   Let us know if not better

## 2018-07-04 NOTE — Progress Notes (Signed)
Subjective:    Patient ID: Danny Navarro, male    DOB: 1955-09-21, 63 y.o.   MRN: [id number]  CC: Danny Navarro is a 63 y.o. male who presents today for an acute visit.    HPI: Chief complaint of congestion x2 or 3 weeks, unchanged.  Tried Mucinex, Sudafed, Flonase without relief. Endorses facial pain, "my teeth hurt". No cough, wheezing, sob, fever.   Former smoker.  HTN- BP at  Home 115/70. No Cp.      HISTORY:  Past Medical History:  Diagnosis Date  . Anxiety   . Hyperlipidemia   . Hypertension   . Screening 06/04/2008   GXT, walked 15:25. No CO or ECG changes   Past Surgical History:  Procedure Laterality Date  . BACK SURGERY  1998   x2   Family History  Problem Relation Age of Onset  . Coronary artery disease Mother   . Kidney disease Mother   . Heart disease Mother   . Heart attack Mother   . Cancer Father   . Multiple myeloma Father     Allergies: Other and Declomycin [demeclocycline] Current Outpatient Medications on File Prior to Visit  Medication Sig Dispense Refill  . Coenzyme Q10 10 MG capsule Take by mouth.    . ezetimibe (ZETIA) 10 MG tablet Take 1 tablet (10 mg total) by mouth daily. 90 tablet 3  . ibuprofen (ADVIL,MOTRIN) 800 MG tablet TAKE 1 TABLET BY MOUTH THREE TIMES DAILY AS NEEDED WITH FOOD  2  . Multiple Vitamins-Minerals (CENTURY) TABS Take 1 tablet by mouth daily.      . ramipril (ALTACE) 5 MG capsule TAKE 1 CAPSULE EVERY DAY 30 capsule 3  . Red Yeast Rice 600 MG CAPS Take 2 capsules by mouth at bedtime.     . vitamin C (ASCORBIC ACID) 500 MG tablet Take 500 mg by mouth daily.     Current Facility-Administered Medications on File Prior to Visit  Medication Dose Route Frequency Provider Last Rate Last Dose  . 0.9 %  sodium chloride infusion  500 mL Intravenous Continuous Armbruster, Carlota Raspberry, MD        Social History   Tobacco Use  . Smoking status: Former Smoker    Last attempt to quit: 06/20/1978    Years since quitting: 40.0  .  Smokeless tobacco: Never Used  Substance Use Topics  . Alcohol use: No  . Drug use: No    Review of Systems  Constitutional: Negative for chills and fever.  HENT: Positive for congestion and sinus pressure. Negative for facial swelling, sinus pain, sore throat and trouble swallowing.   Respiratory: Negative for cough and shortness of breath.   Cardiovascular: Negative for chest pain and palpitations.  Gastrointestinal: Negative for nausea and vomiting.  Neurological: Negative for dizziness and headaches.      Objective:    BP 132/80 (BP Location: Left Arm, Patient Position: Sitting, Cuff Size: Normal)   Pulse 92   Temp 97.7 F (36.5 C)   Wt 186 lb 12.8 oz (84.7 kg)   SpO2 100%   BMI 25.33 kg/m    Physical Exam Vitals signs reviewed.  Constitutional:      Appearance: He is well-developed.  HENT:     Head: Normocephalic and atraumatic.     Right Ear: Hearing, tympanic membrane, ear canal and external ear normal. No decreased hearing noted. No drainage, swelling or tenderness. No middle ear effusion. Tympanic membrane is not injected, erythematous or bulging.  Left Ear: Hearing, tympanic membrane, ear canal and external ear normal. No decreased hearing noted. No drainage, swelling or tenderness.  No middle ear effusion. Tympanic membrane is not injected, erythematous or bulging.     Nose: Nose normal.     Right Sinus: No maxillary sinus tenderness or frontal sinus tenderness.     Left Sinus: No maxillary sinus tenderness or frontal sinus tenderness.     Mouth/Throat:     Pharynx: Uvula midline. No oropharyngeal exudate or posterior oropharyngeal erythema.     Tonsils: No tonsillar abscesses.  Eyes:     Conjunctiva/sclera: Conjunctivae normal.  Cardiovascular:     Rate and Rhythm: Regular rhythm.     Heart sounds: Normal heart sounds.  Pulmonary:     Effort: Pulmonary effort is normal. No respiratory distress.     Breath sounds: Normal breath sounds. No wheezing,  rhonchi or rales.  Lymphadenopathy:     Head:     Right side of head: No submental, submandibular, tonsillar, preauricular, posterior auricular or occipital adenopathy.     Left side of head: No submental, submandibular, tonsillar, preauricular, posterior auricular or occipital adenopathy.     Cervical: No cervical adenopathy.  Skin:    General: Skin is warm and dry.  Neurological:     Mental Status: He is alert.  Psychiatric:        Speech: Speech normal.        Behavior: Behavior normal.        Assessment & Plan:    1. Acute non-recurrent maxillary sinusitis Afebrile, patient is nontoxic in appearance.  Based on duration of symptoms, we jointly agreed reasonable to start antibiotic therapy.  Patient will me know if not better. - amoxicillin-clavulanate (AUGMENTIN) 875-125 MG tablet; Take 1 tablet by mouth 2 (two) times daily for 7 days.  Dispense: 14 tablet; Refill: 0   I am having Danny Navarro maintain his CENTURY, Red Yeast Rice, vitamin C, ibuprofen, Coenzyme Q10, ezetimibe, and ramipril. We will continue to administer sodium chloride.   No orders of the defined types were placed in this encounter.   Return precautions given.   Risks, benefits, and alternatives of the medications and treatment plan prescribed today were discussed, and patient expressed understanding.   Education regarding symptom management and diagnosis given to patient on AVS.  Continue to follow with Leone Haven, MD for routine health maintenance.   Danny Navarro and I agreed with plan.   Mable Paris, FNP

## 2018-07-24 ENCOUNTER — Other Ambulatory Visit: Payer: Self-pay | Admitting: Family Medicine

## 2018-10-03 ENCOUNTER — Ambulatory Visit: Payer: No Typology Code available for payment source | Admitting: Family Medicine

## 2019-01-17 ENCOUNTER — Other Ambulatory Visit: Payer: Self-pay

## 2019-01-17 ENCOUNTER — Ambulatory Visit: Payer: No Typology Code available for payment source | Admitting: Podiatry

## 2019-01-17 ENCOUNTER — Encounter: Payer: Self-pay | Admitting: Podiatry

## 2019-01-17 DIAGNOSIS — L84 Corns and callosities: Secondary | ICD-10-CM

## 2019-01-17 NOTE — Progress Notes (Signed)
This patient presents the office with chief complaint of a painful callus between his fourth and fifth toes right foot.  Patient states that he believes he has formed a callus which has been present for months but has been painful for the last 2 weeks.  He says that he has broken his fifth toe previously in life.  He says this area has become increasingly painful and has attempted to separate the toes but the problem persists.  He presents the office today for an evaluation and treatment of this skin lesion right foot.  Vascular  Dorsalis pedis and posterior tibial pulses are palpable  B/L.  Capillary return  WNL.  Temperature gradient is  WNL.  Skin turgor  WNL  Sensorium  Senn Weinstein monofilament wire  WNL. Normal tactile sensation.  Nail Exam  Patient has normal nails with no evidence of bacterial or fungal infection.  Orthopedic  Exam  Muscle tone and muscle strength  WNL.  No limitations of motion feet  B/L.  No crepitus or joint effusion noted.  Foot type is unremarkable and digits show no abnormalities.  Bony prominences are unremarkable.  Skin  No open lesions.  Normal skin texture and turgor. Heloma molle 4/5 right foot  Heloma mole 4th interspace right foot.  IE.  Debride HM 4th interspace right foot.  Dispense padding.  Discussed surgery as needed in future.   Gardiner Barefoot DPM

## 2019-01-25 ENCOUNTER — Ambulatory Visit: Payer: No Typology Code available for payment source | Admitting: Family Medicine

## 2019-02-20 ENCOUNTER — Other Ambulatory Visit: Payer: Self-pay | Admitting: Family Medicine

## 2019-03-16 ENCOUNTER — Other Ambulatory Visit: Payer: Self-pay | Admitting: Cardiovascular Disease

## 2019-03-26 ENCOUNTER — Other Ambulatory Visit: Payer: Self-pay | Admitting: Family Medicine

## 2019-03-26 NOTE — Telephone Encounter (Signed)
ramipril (ALTACE) 5 MG capsule  Please refill if possible. Pt has his yearly physical on the 10/27, promises will keep, did cancel a couple appt back in the winter and spring due to [place]. Out of med. Pls call him at [phone] if need to do [place], [place] - [place] [phone] (Phone) [phone] (Fax)    a virtual for med refill before appt on 10/27

## 2019-03-26 NOTE — Telephone Encounter (Signed)
Requested medication (s) are due for refill today: yes  Requested medication (s) are on the active medication list: yes  Last refill:  02/20/2019  Future visit scheduled: yes  Notes to clinic: Please refill if possible. Pt has his yearly physical on the 10/27, promises will keep, did cancel a couple appt back in the winter and spring due to [place]. Out of med. Pls call him at [phone] if need to do    Requested Prescriptions  Pending Prescriptions Disp Refills   ramipril (ALTACE) 5 MG capsule 30 capsule 0    Sig: Take 1 capsule (5 mg total) by mouth daily.     Cardiovascular:  ACE Inhibitors Failed - 03/26/2019  8:29 AM      Failed - Cr in normal range and within 180 days    Creatinine, Ser  Date Value Ref Range Status  04/02/2018 0.69 0.40 - 1.50 mg/dL Final         Failed - K in normal range and within 180 days    Potassium  Date Value Ref Range Status  04/02/2018 4.6 3.5 - 5.1 mEq/L Final         Failed - Last BP in normal range    BP Readings from Last 1 Encounters:  01/17/19 (!) 140/93         Failed - Valid encounter within last 6 months    Recent Outpatient Visits          8 months ago Acute non-recurrent maxillary sinusitis   [facility] [place] Arnett, Yvetta Coder, FNP   11 months ago Routine general medical examination at a health care facility   [facility] Caryl Bis, Angela Adam, MD   1 year ago Essential hypertension   [place], Eric G, MD   2 years ago Routine general medical examination at a health care facility   [facility] Leone Haven, MD   2 years ago Essential hypertension   [place], Angela Adam, MD      Future Appointments            In 3 weeks Caryl Bis, Angela Adam, MD [place], [place] - Patient is not pregnant

## 2019-03-26 NOTE — Telephone Encounter (Signed)
Patient called in and is checking on status of refill as he is completely out of medication. Please advise as he would like a refill to hold him over till appointment 04/16/19.

## 2019-03-28 ENCOUNTER — Telehealth: Payer: Self-pay | Admitting: Cardiovascular Disease

## 2019-03-28 ENCOUNTER — Other Ambulatory Visit: Payer: Self-pay

## 2019-03-28 NOTE — Telephone Encounter (Signed)
Dr. Rockey Situ- clarified with Danny Navarro that the patient and his wife are wanting recommendations from you for another PCP as they feel his current PCP is not a "good fit" for him.

## 2019-03-28 NOTE — Telephone Encounter (Signed)
*  STAT* If patient is at the pharmacy, call can be transferred to refill team.   1. Which medications need to be refilled? (please list name of each medication and dose if known) Ramipril  2. Which pharmacy/location (including street and city if local pharmacy) is medication to be sent to? [facility]  3. Do they need a 30 day or 90 day supply? [place]

## 2019-03-28 NOTE — Telephone Encounter (Signed)
Please call with recommendation for PCP

## 2019-03-28 NOTE — Telephone Encounter (Signed)
Attempted to call the patient/ or his wife back. No answer- I left a message to please call back.

## 2019-03-30 NOTE — Telephone Encounter (Signed)
Do they want to change from Dr. Caryl Bis? Could they stay with Mable Paris? Or need other

## 2019-04-02 NOTE — Telephone Encounter (Signed)
Attempted to call the patient/ his wife about MD response. No answer- I left a message to please call back.

## 2019-04-03 NOTE — Telephone Encounter (Signed)
Perhaps stay with Danny Navarro ?

## 2019-04-12 ENCOUNTER — Other Ambulatory Visit: Payer: Self-pay

## 2019-04-16 ENCOUNTER — Ambulatory Visit (INDEPENDENT_AMBULATORY_CARE_PROVIDER_SITE_OTHER): Payer: No Typology Code available for payment source | Admitting: Family Medicine

## 2019-04-16 ENCOUNTER — Encounter: Payer: Self-pay | Admitting: Family Medicine

## 2019-04-16 ENCOUNTER — Other Ambulatory Visit: Payer: Self-pay

## 2019-04-16 VITALS — BP 130/80 | HR 97 | Temp 97.3°F | Ht 72.0 in | Wt 187.2 lb

## 2019-04-16 DIAGNOSIS — Z0000 Encounter for general adult medical examination without abnormal findings: Secondary | ICD-10-CM

## 2019-04-16 DIAGNOSIS — Z125 Encounter for screening for malignant neoplasm of prostate: Secondary | ICD-10-CM | POA: Diagnosis not present

## 2019-04-16 DIAGNOSIS — E785 Hyperlipidemia, unspecified: Secondary | ICD-10-CM

## 2019-04-16 DIAGNOSIS — E663 Overweight: Secondary | ICD-10-CM | POA: Diagnosis not present

## 2019-04-16 LAB — COMPREHENSIVE METABOLIC PANEL
ALT: 29 U/L (ref 0–53)
AST: 21 U/L (ref 0–37)
Albumin: 4.7 g/dL (ref 3.5–5.2)
Alkaline Phosphatase: 78 U/L (ref 39–117)
BUN: 16 mg/dL (ref 6–23)
CO2: 30 mEq/L (ref 19–32)
Calcium: 9.8 mg/dL (ref 8.4–10.5)
Chloride: 101 mEq/L (ref 96–112)
Creatinine, Ser: 0.57 mg/dL (ref 0.40–1.50)
GFR: 143.81 mL/min (ref >60.00)
Glucose, Bld: 106 mg/dL — ABNORMAL HIGH (ref 70–99)
Potassium: 4.4 mEq/L (ref 3.5–5.1)
Sodium: 138 mEq/L (ref 135–145)
Total Bilirubin: 0.5 mg/dL (ref 0.2–1.2)
Total Protein: 7 g/dL (ref 6.0–8.3)

## 2019-04-16 LAB — LIPID PANEL
Cholesterol: 198 mg/dL (ref 0–200)
HDL: 48.8 mg/dL (ref >39.00)
LDL Cholesterol: 124 mg/dL — ABNORMAL HIGH (ref 0–99)
NonHDL: 149.46
Total CHOL/HDL Ratio: 4
Triglycerides: 128 mg/dL (ref 0.0–149.0)
VLDL: 25.6 mg/dL (ref 0.0–40.0)

## 2019-04-16 LAB — HEMOGLOBIN A1C: Hgb A1c MFr Bld: 5.6 % (ref 4.6–6.5)

## 2019-04-16 MED ORDER — EZETIMIBE 10 MG PO TABS: 10.0000 mg | ORAL_TABLET | Freq: Every day | ORAL | 2 refills | Status: DC

## 2019-04-16 MED ORDER — RAMIPRIL 5 MG PO CAPS: ORAL_CAPSULE | ORAL | 2 refills | Status: DC

## 2019-04-16 NOTE — Patient Instructions (Addendum)
Nice to see you. Please get back to exercising. Please continue to healthy diet. We will get lab work today and contact you with the results.

## 2019-04-16 NOTE — Progress Notes (Signed)
Tommi Rumps, MD Phone: [phone]  Danny Navarro is a 63 y.o. male who presents today for CPE.  Exercise: Not as much recently as his mother passed away and has been dealing with her estate.  He has been staying active at work. Diet: He tries to eat a healthy diet.  Rare soda.  No sweet tea. Colonoscopy up-to-date 04/12/2016 with 10-year recall. No family history of prostate cancer or colon cancer. Tetanus vaccines up-to-date.  He declines flu vaccine and Shingrix. HIV screening up-to-date. No tobacco use, alcohol use, or illicit drug use. Sees a dentist and an ophthalmologist. His mother recently passed away.  He notes he has been dealing with this fairly well. Recently treated for sinus infection with Augmentin.  Symptoms improved very quickly with antibiotics.  He does occasionally use Zyrtec and Flonase.  Active Ambulatory Problems    Diagnosis Date Noted  . Hyperlipidemia 06/04/2008  . HYPERTENSION, BENIGN 06/04/2008  . Low back pain 10/09/2015  . Routine general medical examination at a health care facility 12/02/2015  . PVC's (premature ventricular contractions) 06/26/2017  . Family history of coronary artery disease 06/26/2017  . Anxiety 06/26/2017  . Allergic rhinitis 09/29/2017  . 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 Acute bronchitis 03/23/2016   Past Medical History:  Diagnosis Date  . Hypertension   . Screening 06/04/2008    Family History  Problem Relation Age of Onset  . Coronary artery disease Mother   . Kidney disease Mother   . Heart disease Mother   . Heart attack Mother   . Cancer Father   . Multiple myeloma Father     Social History   Socioeconomic History  . Marital status: Married    Spouse name: Not on file  . Number of children: Not on file  . Years of education: Not on file  . Highest education level: Not on file  Occupational History  . 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 Financial resource strain: Not on file  . Food insecurity    Worry: Not on file    Inability: Not on file  . Transportation needs    Medical: Not on file    Non-medical: Not on file  Tobacco Use  . Smoking status: Former Smoker    Quit date: 06/20/1978    Years since quitting: 40.8  . Smokeless tobacco: Never Used  Substance and Sexual Activity  . Alcohol use: No  . Drug use: No  . Sexual activity: Not on file  Lifestyle  . Physical activity    Days per week: Not on file    Minutes per session: Not on file  . Stress: Not on file  Relationships  . Social Herbalist on phone: Not on file    Gets together: Not on file    Attends religious service: Not on file    Active member of club or organization: Not on file    Attends meetings of clubs or organizations: Not on file    Relationship status: Not on file  . Intimate partner violence    Fear of current or ex partner: Not on file    Emotionally abused: Not on file    Physically abused: Not on file    Forced sexual activity: Not on file  Other Topics Concern  . Not on file  Social History Narrative   Married with  2 kids   Owns his own business   Gets regular exercise    ROS  General:  Negative for nexplained weight loss, fever Skin: Negative for new or changing mole, sore that won't heal HEENT: Negative for trouble hearing, trouble seeing, ringing in ears, mouth sores, hoarseness, change in voice, dysphagia. CV:  Negative for chest pain, dyspnea, edema, palpitations Resp: Negative for cough, dyspnea, hemoptysis GI: Negative for nausea, vomiting, diarrhea, constipation, abdominal pain, melena, hematochezia. GU: Negative for dysuria, incontinence, urinary hesitance, hematuria, vaginal or penile discharge, polyuria, sexual difficulty, lumps in testicle or breasts MSK: Negative for muscle cramps or aches, joint pain or swelling Neuro: Negative for headaches, weakness, numbness,  dizziness, passing out/fainting Psych: Negative for depression, anxiety, memory problems  Objective  Physical Exam Vitals:   04/16/19 0832  BP: 130/80  Pulse: 97  Temp: (!) 97.3 F (36.3 C)  SpO2: 98%    BP Readings from Last 3 Encounters:  04/16/19 130/80  01/17/19 (!) 140/93  07/04/18 132/80   Wt Readings from Last 3 Encounters:  04/16/19 187 lb 3.2 oz (84.9 kg)  07/04/18 186 lb 12.8 oz (84.7 kg)  05/21/18 188 lb 12 oz (85.6 kg)    Physical Exam Constitutional:      General: He is not in acute distress.    Appearance: He is not diaphoretic.  HENT:     Head: Normocephalic and atraumatic.  Eyes:     Conjunctiva/sclera: Conjunctivae normal.     Pupils: Pupils are equal, round, and reactive to light.  Cardiovascular:     Rate and Rhythm: Normal rate and regular rhythm.     Heart sounds: Normal heart sounds.  Pulmonary:     Effort: Pulmonary effort is normal.     Breath sounds: Normal breath sounds.  Abdominal:     General: Bowel sounds are normal. There is no distension.     Palpations: Abdomen is soft.     Tenderness: There is no abdominal tenderness. There is no guarding or rebound.  Musculoskeletal:     Right lower leg: No edema.     Left lower leg: No edema.  Lymphadenopathy:     Cervical: No cervical adenopathy.  Skin:    General: Skin is warm and dry.  Neurological:     Mental Status: He is alert.  Psychiatric:        Mood and Affect: Mood normal.      Assessment/Plan:   Routine general medical examination at a health care facility Physical exam completed.  Encouraged continued healthy diet and remaining active.  PSA for prostate cancer screening.  He declines flu and shingles vaccines.  Lab work as outlined below.  Discussed that it is not uncommon to get one sinus infection or viral infection yearly.  Discussed adequate hygiene measures.  Discussed continued use of allergy medications.  Offered support regarding his mother's passing.   Orders  Placed This Encounter  Procedures  . Comp Met (CMET)  . PSA  . Lipid panel  . HgB A1c    Meds ordered this encounter  Medications  . ezetimibe (ZETIA) 10 MG tablet    Sig: Take 1 tablet (10 mg total) by mouth daily.    Dispense:  90 tablet    Refill:  2  . ramipril (ALTACE) 5 MG capsule    Sig: TAKE 1 CAPSULE BY MOUTH ONCE DAILY    Dispense:  90 capsule    Refill:  2    PT NEEDS REFILL RX THANKS  Tommi Rumps, MD [place]

## 2019-04-16 NOTE — Assessment & Plan Note (Addendum)
Physical exam completed.  Encouraged continued healthy diet and remaining active.  PSA for prostate cancer screening.  He declines flu and shingles vaccines.  Lab work as outlined below.  Discussed that it is not uncommon to get one sinus infection or viral infection yearly.  Discussed adequate hygiene measures.  Discussed continued use of allergy medications.  Offered support regarding his mother's passing.

## 2019-04-18 LAB — PSA: PSA: 1.05 ng/mL (ref 0.10–4.00)

## 2019-10-15 ENCOUNTER — Ambulatory Visit: Payer: No Typology Code available for payment source | Admitting: Family Medicine

## 2019-10-15 ENCOUNTER — Encounter: Payer: Self-pay | Admitting: Family Medicine

## 2019-10-15 ENCOUNTER — Other Ambulatory Visit: Payer: Self-pay

## 2019-10-15 VITALS — BP 130/80 | HR 94 | Temp 96.1°F | Ht 72.0 in | Wt 191.0 lb

## 2019-10-15 DIAGNOSIS — E782 Mixed hyperlipidemia: Secondary | ICD-10-CM | POA: Diagnosis not present

## 2019-10-15 DIAGNOSIS — I10 Essential (primary) hypertension: Secondary | ICD-10-CM

## 2019-10-15 LAB — BASIC METABOLIC PANEL
BUN: 20 mg/dL (ref 6–23)
CO2: 29 mEq/L (ref 19–32)
Calcium: 9.5 mg/dL (ref 8.4–10.5)
Chloride: 100 mEq/L (ref 96–112)
Creatinine, Ser: 0.7 mg/dL (ref 0.40–1.50)
GFR: 113.28 mL/min (ref >60.00)
Glucose, Bld: 94 mg/dL (ref 70–99)
Potassium: 4.8 mEq/L (ref 3.5–5.1)
Sodium: 136 mEq/L (ref 135–145)

## 2019-10-15 LAB — LIPID PANEL
Cholesterol: 233 mg/dL — ABNORMAL HIGH (ref 0–200)
HDL: 29.7 mg/dL — ABNORMAL LOW (ref >39.00)
Total CHOL/HDL Ratio: 8
Triglycerides: 875 mg/dL — ABNORMAL HIGH (ref 0.0–149.0)

## 2019-10-15 LAB — LDL CHOLESTEROL, DIRECT: Direct LDL: 86 mg/dL

## 2019-10-15 NOTE — Patient Instructions (Signed)
Nice to see you. We will check labs.  

## 2019-10-15 NOTE — Assessment & Plan Note (Signed)
Adequately controlled.  Continue current regimen.  Check BMP. 

## 2019-10-15 NOTE — Assessment & Plan Note (Signed)
Continue Zetia.  Check lipid panel. 

## 2019-10-15 NOTE — Progress Notes (Signed)
  Marikay Alar, MD Phone: [phone]  Danny Navarro is a 64 y.o. male who presents today for f/u.  HYPERTENSION  Disease Monitoring  Home BP Monitoring not checking consistently Chest pain- no    Dyspnea- no Medications  Compliance-  Taking ramipril.   Edema- no  HYPERLIPIDEMIA Symptoms Chest pain on exertion:  no   Leg claudication:   no Medications: Compliance- taking zetia Right upper quadrant pain- no  Muscle aches- no  Patient notes his granddaughter was diagnosed with leukemia in March.  He has had a tough time dealing with that though has gotten through it. Doing ok now.  She has gone through chemo induction and is now in remission.  She will be doing maintenance therapy for the next year or so.  He does have support at home.  He notes no depression though does note he had a broken heart initially.  Social History   Tobacco Use  Smoking Status Former Smoker  . Quit date: 06/20/1978  . Years since quitting: 41.3  Smokeless Tobacco Never Used     ROS see history of present illness  Objective  Physical Exam Vitals:   10/15/19 0810  BP: 130/80  Pulse: 94  Temp: (!) 96.1 F (35.6 C)  SpO2: 98%    BP Readings from Last 3 Encounters:  10/15/19 130/80  04/16/19 130/80  01/17/19 (!) 140/93   Wt Readings from Last 3 Encounters:  10/15/19 191 lb (86.6 kg)  04/16/19 187 lb 3.2 oz (84.9 kg)  07/04/18 186 lb 12.8 oz (84.7 kg)    Physical Exam Constitutional:      General: He is not in acute distress.    Appearance: He is not diaphoretic.  Cardiovascular:     Rate and Rhythm: Normal rate and regular rhythm.     Heart sounds: Normal heart sounds.  Pulmonary:     Effort: Pulmonary effort is normal.     Breath sounds: Normal breath sounds.  Musculoskeletal:     Right lower leg: No edema.     Left lower leg: No edema.  Skin:    General: Skin is warm and dry.  Neurological:     Mental Status: He is alert.      Assessment/Plan: Please see individual  problem list.  HYPERTENSION, BENIGN Adequately controlled.  Continue current regimen.  Check BMP.  Hyperlipidemia Continue Zetia.  Check lipid panel.   Offered support regarding his granddaughter.  Discussed that if the patient needed anything from [place] he should let us know.  Health Maintenance: Patient defers COVID-19 vaccine at this time.  Orders Placed This Encounter  Procedures  . Basic Metabolic Panel (BMET)  . Lipid panel    No orders of the defined types were placed in this encounter.   This visit occurred during the SARS-CoV-2 public health emergency.  Safety protocols were in place, including screening questions prior to the visit, additional usage of staff PPE, and extensive cleaning of exam room while observing appropriate contact time as indicated for disinfecting solutions.    Marikay Alar, MD [facility] Primary Care [facility]

## 2019-10-16 ENCOUNTER — Encounter: Payer: Self-pay | Admitting: Family Medicine

## 2019-10-16 DIAGNOSIS — E781 Pure hyperglyceridemia: Secondary | ICD-10-CM

## 2019-10-19 ENCOUNTER — Other Ambulatory Visit: Payer: Self-pay | Admitting: Family Medicine

## 2019-10-22 ENCOUNTER — Other Ambulatory Visit (INDEPENDENT_AMBULATORY_CARE_PROVIDER_SITE_OTHER): Payer: No Typology Code available for payment source

## 2019-10-22 ENCOUNTER — Other Ambulatory Visit: Payer: Self-pay

## 2019-10-22 DIAGNOSIS — E781 Pure hyperglyceridemia: Secondary | ICD-10-CM | POA: Diagnosis not present

## 2019-10-22 LAB — LIPID PANEL
Cholesterol: 201 mg/dL — ABNORMAL HIGH (ref 0–200)
HDL: 46.1 mg/dL (ref >39.00)
LDL Cholesterol: 139 mg/dL — ABNORMAL HIGH (ref 0–99)
NonHDL: 155.27
Total CHOL/HDL Ratio: 4
Triglycerides: 83 mg/dL (ref 0.0–149.0)
VLDL: 16.6 mg/dL (ref 0.0–40.0)

## 2019-10-23 ENCOUNTER — Telehealth: Payer: Self-pay

## 2019-10-23 NOTE — Telephone Encounter (Signed)
-----   Message from Eric G Sonnenberg, MD sent at 10/22/2019  4:08 PM EDT ----- Please let the patient know that his triglycerides are in the normal range. I do not have a great explanation for why they were so high previously. I would like for him to consider a statin for his LDL cholesterol as it is above goal of less than 100. If he is willing to try this I can send one to his pharmacy.   The 10-year ASCVD risk score (Goff DC Jr., et al., 2013) is: 14.8%   Values used to calculate the score:     Age: 63 years     Sex: Male     Is Non-Hispanic African American: No     Diabetic: No     Tobacco smoker: No     Systolic Blood Pressure: 130 mmHg     Is BP treated: Yes     HDL Cholesterol: 46.1 mg/dL     Total Cholesterol: 201 mg/dL  

## 2019-10-24 NOTE — Telephone Encounter (Signed)
-----   Message from Glori Luis, MD sent at 10/22/2019  4:08 PM EDT ----- Please let the patient know that his triglycerides are in the normal range. I do not have a great explanation for why they were so high previously. I would like for him to consider a statin for his LDL cholesterol as it is above goal of less than 100. If he is willing to try this I can send one to his pharmacy.   The 10-year ASCVD risk score Denman George DC Montez Hageman., et al., 2013) is: 14.8%   Values used to calculate the score:     Age: 64 years     Sex: Male     Is Non-Hispanic African American: No     Diabetic: No     Tobacco smoker: No     Systolic Blood Pressure: 130 mmHg     Is BP treated: Yes     HDL Cholesterol: 46.1 mg/dL     Total Cholesterol: 201 mg/dL

## 2019-11-20 ENCOUNTER — Other Ambulatory Visit: Payer: No Typology Code available for payment source

## 2020-01-24 ENCOUNTER — Other Ambulatory Visit: Payer: Self-pay | Admitting: Cardiovascular Disease

## 2020-03-12 IMAGING — CT CT HEART SCORING
2 series · 16 of 20 positions shown, 18 images · non-contrast
Comparison: None.

Addendum:
EXAM:
OVER-READ INTERPRETATION  CT CHEST

The following report is an over-read performed by radiologist Dr.
Arbeert Murana [REDACTED] on 06/06/2018. This
over-read does not include interpretation of cardiac or coronary
anatomy or pathology. The coronary calcium score interpretation by
the cardiologist is attached.
CLINICAL DATA: Risk stratification
Coronary Calcium Score
TECHNIQUE: The patient was scanned on a Siemens Force scanner. Axial
non-contrast 3 mm slices were carried out through the heart. The
data set was analyzed on a dedicated work station and scored using
the Agatson method.

[Series 3: casc 3.0 i36f 2 bestdiast 72 % · axial · 0.35mm/px · z∈[-244,-146]mm · 8 of 43 slices shown, 10 images]
[im 5/43  vessel]
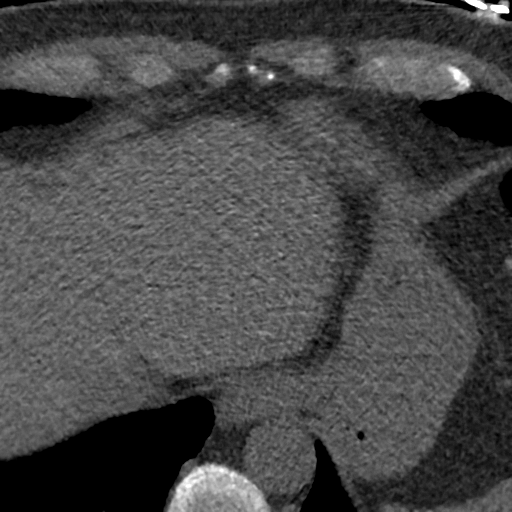
[im 5/43  lung]
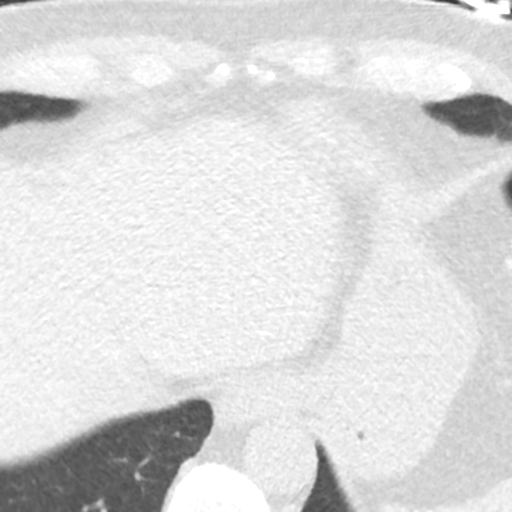
[im 10/43  vessel]
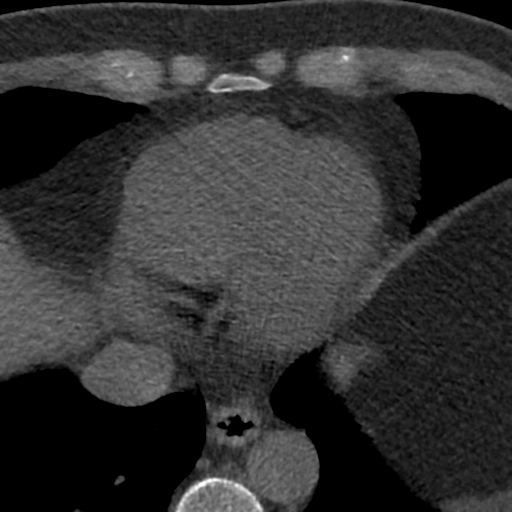
[im 15/43  vessel]
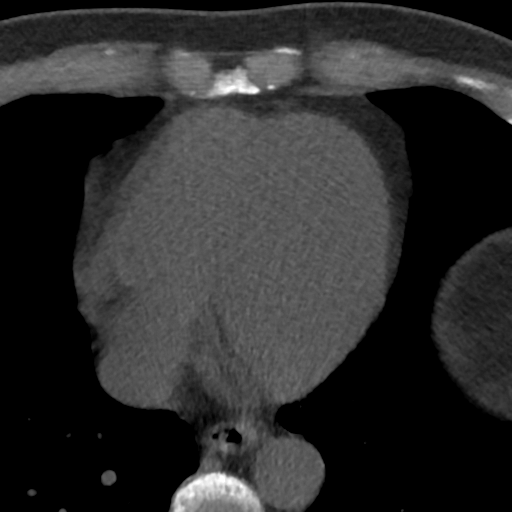
[im 19/43  vessel]
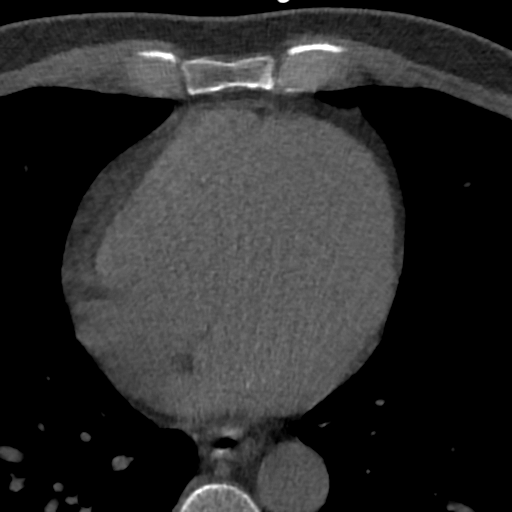
[im 24/43  vessel]
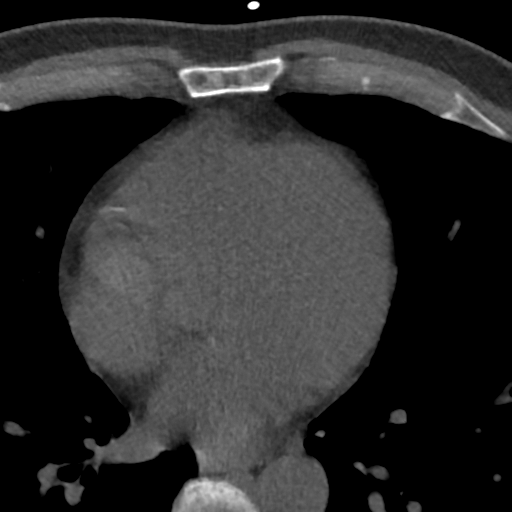
[im 24/43  lung]
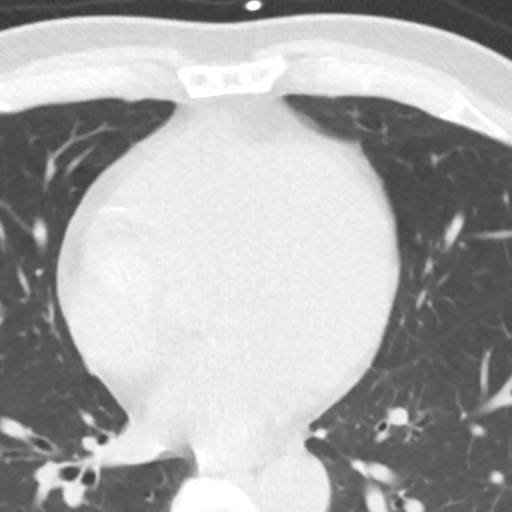
[im 29/43  vessel]
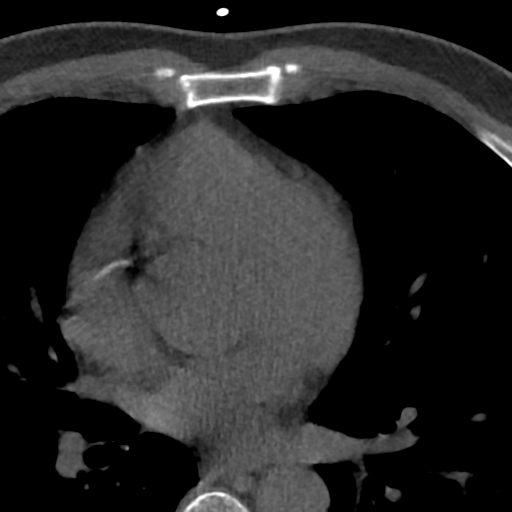
[im 33/43  vessel]
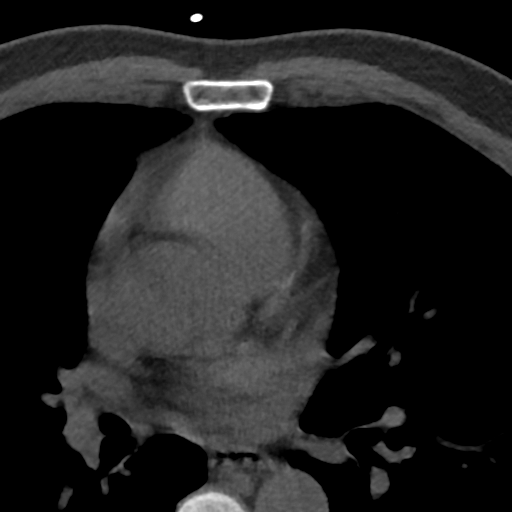
[im 38/43  vessel]
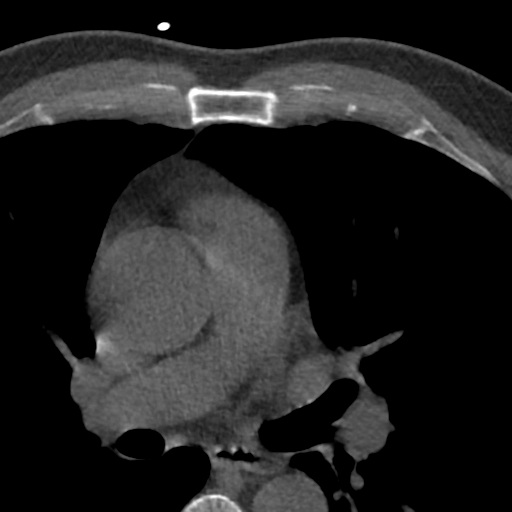

[Series 5: lung st 70 % · axial · 0.68mm/px · z∈[-250,-146]mm · 8 of 46 slices shown]
[im 6/46  lung]
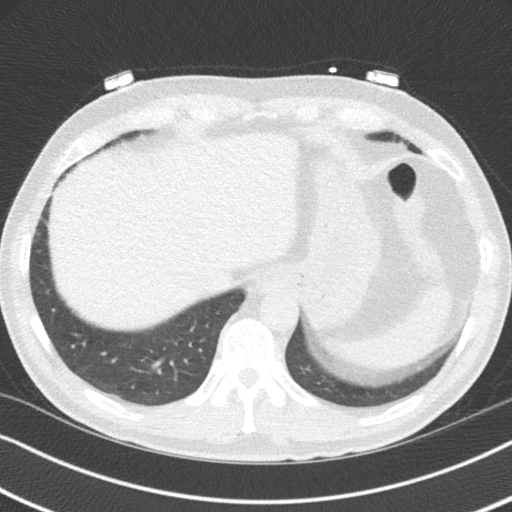
[im 11/46  lung]
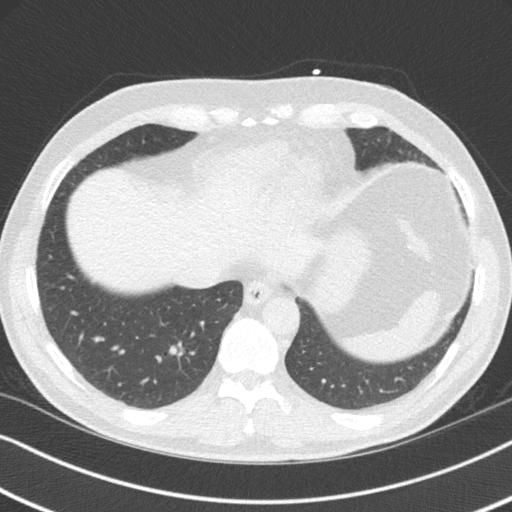
[im 16/46  lung]
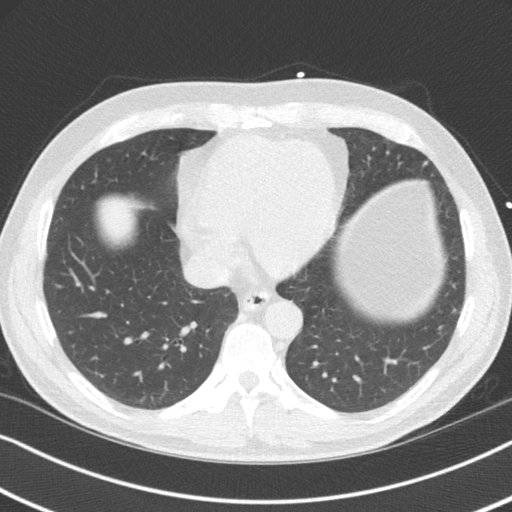
[im 21/46  lung]
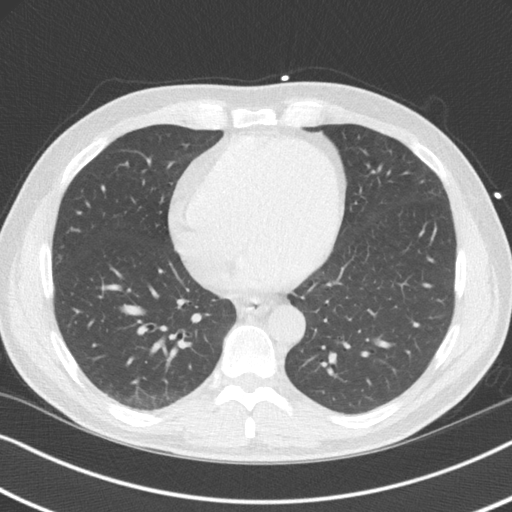
[im 26/46  lung]
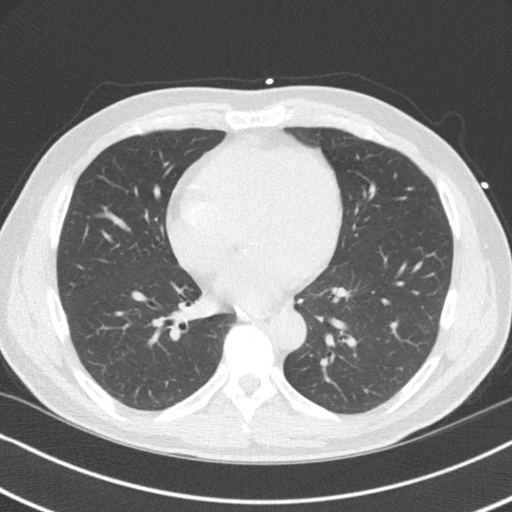
[im 31/46  lung]
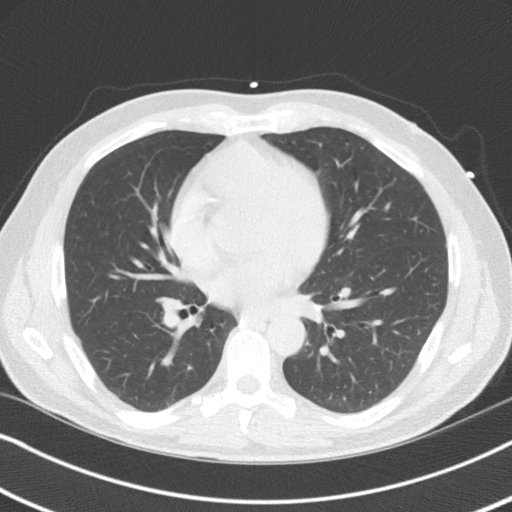
[im 36/46  lung]
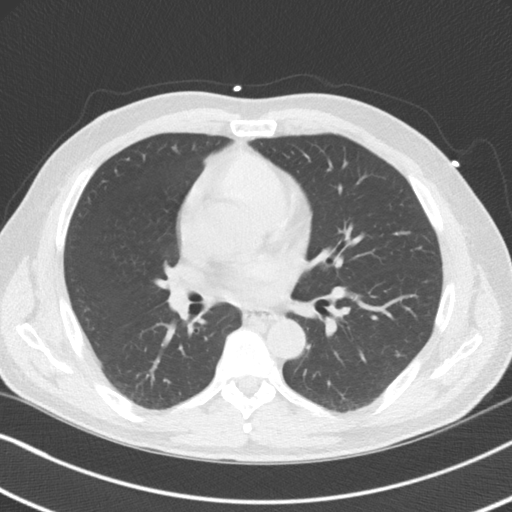
[im 41/46  lung]
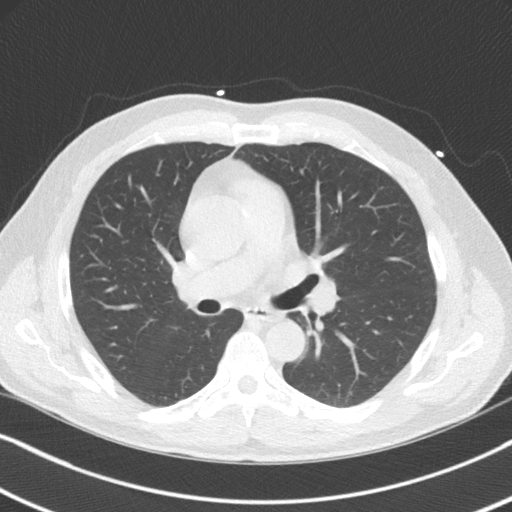

[16 of 20 positions shown; findings below may reference images not displayed]

FINDINGS: Vascular: Heart is normal size. Mild aneurysmal dilatation of the
ascending thoracic aorta, 4.2 cm.

Mediastinum/Nodes: No adenopathy in the lower mediastinum or hila.

Lungs/Pleura: Lungs clear.  No effusions.

Upper Abdomen: Imaging into the upper abdomen shows no acute
findings.

Musculoskeletal: Chest wall soft tissues are unremarkable. No acute
bony abnormality.
IMPRESSION: 4.2 cm ascending thoracic aortic aneurysm. Recommend annual imaging
followup by CTA or MRA. This recommendation follows 1969
ACCF/AHA/AATS/ACR/ASA/SCA/GRINBERG/NENA/MEMELA KISS/JUMPER Guidelines for the
Diagnosis and Management of Patients with Thoracic Aortic Disease.
Circulation. 1969; 121: [id number]
FINDINGS: Non-cardiac: See separate report from [REDACTED].

Ascending Aorta: Mildly dilated in axial views measuring 42 mm. Mild
diffuse calcifications.

Pericardium: Normal.

Coronary arteries: Normal origin.
IMPRESSION: 1. Coronary calcium score of 239. This was 75 percentile for age and
sex matched control.

2. Mildly ascending aortic aneurysm measuring 42 mm in axial views.
Chest CTA or MRA is recommended for full evaluation.

*** End of Addendum ***

## 2020-04-28 ENCOUNTER — Encounter: Payer: Self-pay | Admitting: Family Medicine

## 2020-04-28 ENCOUNTER — Other Ambulatory Visit: Payer: Self-pay

## 2020-04-28 ENCOUNTER — Ambulatory Visit (INDEPENDENT_AMBULATORY_CARE_PROVIDER_SITE_OTHER): Payer: Medicare Other | Admitting: Family Medicine

## 2020-04-28 VITALS — BP 124/90 | HR 105 | Temp 97.8°F | Ht 72.0 in | Wt 189.0 lb

## 2020-04-28 DIAGNOSIS — R1909 Other intra-abdominal and pelvic swelling, mass and lump: Secondary | ICD-10-CM | POA: Diagnosis not present

## 2020-04-28 DIAGNOSIS — R350 Frequency of micturition: Secondary | ICD-10-CM

## 2020-04-28 DIAGNOSIS — I10 Essential (primary) hypertension: Secondary | ICD-10-CM | POA: Diagnosis not present

## 2020-04-28 DIAGNOSIS — R3989 Other symptoms and signs involving the genitourinary system: Secondary | ICD-10-CM | POA: Diagnosis not present

## 2020-04-28 LAB — URINALYSIS, MICROSCOPIC ONLY: RBC / HPF: NONE SEEN (ref 0–?)

## 2020-04-28 LAB — POCT URINALYSIS DIPSTICK
Bilirubin, UA: NEGATIVE
Blood, UA: NEGATIVE
Glucose, UA: NEGATIVE
Nitrite, UA: POSITIVE
Protein, UA: NEGATIVE
Spec Grav, UA: 1.015 (ref 1.010–1.025)
Urobilinogen, UA: 2 E.U./dL — AB
pH, UA: 7 (ref 5.0–8.0)

## 2020-04-28 MED ORDER — SULFAMETHOXAZOLE-TRIMETHOPRIM 800-160 MG PO TABS: 1.0000 | ORAL_TABLET | Freq: Two times a day (BID) | ORAL | 0 refills | Status: DC

## 2020-04-28 NOTE — Patient Instructions (Signed)
Nice to see you. We will treat you for UTI with Bactrim. Somebody will call you to schedule the ultrasound for the nodule. Please try to periodically check your blood pressure.  Your goal is less than 130/80.

## 2020-04-28 NOTE — Progress Notes (Signed)
  Marikay Alar, MD Phone: [phone]  Danny Navarro is a 64 y.o. male who presents today for f/u.  HYPERTENSION  Disease Monitoring  Home BP Monitoring not checking Chest pain- no    Dyspnea- no Medications  Compliance-  Taking ramipril 5 mg daily.   Edema- no  Urine issue Strain- no Flow- decreased over the past week Frequency- no Urgency- no Nocturia- 2x, not changed Dysuria- no Emptying bladder- slight decrease over the past week Patient notes similar symptoms in the past with a UTI.  Groin nodule: Patient noticed this over the last several weeks.  He notes no pain.  Social History   Tobacco Use  Smoking Status Former Smoker  . Quit date: 06/20/1978  . Years since quitting: 41.8  Smokeless Tobacco Never Used     ROS see history of present illness  Objective  Physical Exam Vitals:   04/28/20 1042 04/28/20 1049  BP: 122/84 130/90  Pulse: (!) 105   Temp: 97.8 F (36.6 C)   SpO2: 96%     BP Readings from Last 3 Encounters:  04/28/20 130/90  10/15/19 130/80  04/16/19 130/80   Wt Readings from Last 3 Encounters:  04/28/20 189 lb (85.7 kg)  10/15/19 191 lb (86.6 kg)  04/16/19 187 lb 3.2 oz (84.9 kg)    Physical Exam Constitutional:      General: He is not in acute distress.    Appearance: He is not diaphoretic.  Cardiovascular:     Rate and Rhythm: Normal rate and regular rhythm.     Heart sounds: Normal heart sounds.  Pulmonary:     Effort: Pulmonary effort is normal.     Breath sounds: Normal breath sounds.  Genitourinary:      Comments: Normal rectum, prostate is normal with no tenderness or bogginess, no nodules palpated Skin:    General: Skin is warm and dry.  Neurological:     Mental Status: He is alert.      Assessment/Plan: Please see individual problem list.  Problem List Items Addressed This Visit    HYPERTENSION, BENIGN    Generally well controlled.  Suspect slight elevation today given prostate exam.  He will continue with  quinapril 5 mg once daily.  Check BMP.      Relevant Orders   Basic Metabolic Panel (BMET)   Nodule of groin    Possibly lymph node.  We will obtain an ultrasound to evaluate further.      Relevant Orders   US SOFT TISSUE LOWER EXTREMITY LIMITED RIGHT (NON-VASCULAR)   Urine troubles    Urinalysis is concerning for UTI.  His prostate exam is normal and does not indicate prostatitis.  We will treat empirically with Bactrim and send his urine for culture and microscopy.      Relevant Medications   sulfamethoxazole-trimethoprim (BACTRIM DS) 800-160 MG tablet   Other Relevant Orders   Urine Culture   Urine Microscopic    Other Visit Diagnoses    Urinary frequency    -  Primary   Relevant Orders   POCT Urinalysis Dipstick (Completed)      This visit occurred during the SARS-CoV-2 public health emergency.  Safety protocols were in place, including screening questions prior to the visit, additional usage of staff PPE, and extensive cleaning of exam room while observing appropriate contact time as indicated for disinfecting solutions.    Marikay Alar, MD [facility] Primary Care [facility]

## 2020-04-28 NOTE — Assessment & Plan Note (Signed)
Urinalysis is concerning for UTI.  His prostate exam is normal and does not indicate prostatitis.  We will treat empirically with Bactrim and send his urine for culture and microscopy.

## 2020-04-28 NOTE — Addendum Note (Signed)
Addended by: Bonnell Public I on: 04/28/2020 03:27 PM   Modules accepted: Orders

## 2020-04-28 NOTE — Assessment & Plan Note (Signed)
Generally well controlled.  Suspect slight elevation today given prostate exam.  He will continue with quinapril 5 mg once daily.  Check BMP.

## 2020-04-28 NOTE — Assessment & Plan Note (Signed)
Possibly lymph node.  We will obtain an ultrasound to evaluate further.

## 2020-04-29 ENCOUNTER — Ambulatory Visit
Admission: RE | Admit: 2020-04-29 | Discharge: 2020-04-29 | Disposition: A | Discharge status: Home / Self Care | Payer: Medicare Other | Source: Ambulatory Visit | Attending: Family Medicine | Admitting: Family Medicine

## 2020-04-29 DIAGNOSIS — R1909 Other intra-abdominal and pelvic swelling, mass and lump: Secondary | ICD-10-CM | POA: Diagnosis present

## 2020-04-29 LAB — URINE CULTURE
MICRO NUMBER:: 11179436
Result:: NO GROWTH
SPECIMEN QUALITY:: ADEQUATE

## 2020-04-30 ENCOUNTER — Other Ambulatory Visit: Payer: Self-pay | Admitting: Family Medicine

## 2020-04-30 ENCOUNTER — Telehealth: Payer: Self-pay

## 2020-04-30 ENCOUNTER — Other Ambulatory Visit: Payer: Self-pay | Admitting: Cardiovascular Disease

## 2020-04-30 NOTE — Telephone Encounter (Signed)
Rx request sent to pharmacy.  

## 2020-04-30 NOTE — Telephone Encounter (Signed)
Patient will need an appt for further refills.  Patient was last seen 2019.  If patient does not wish to schedule with [place] he will need to contact PCP for refills, Thanks !

## 2020-04-30 NOTE — Telephone Encounter (Signed)
Called patient  Does not wish to schedule at this time Will call PCP for refill

## 2020-04-30 NOTE — Telephone Encounter (Signed)
-----   Message from Glori Luis, MD sent at 04/30/2020  2:11 PM EST ----- Please let the patient know that his urine culture did not reveal a UTI.  Please see if he is continuing to have symptoms.  Thanks.

## 2020-05-01 NOTE — Telephone Encounter (Signed)
Pt called back returning your call °

## 2020-05-04 NOTE — Telephone Encounter (Signed)
Patient was returning call for results 

## 2020-05-05 ENCOUNTER — Telehealth: Payer: Self-pay

## 2020-05-05 DIAGNOSIS — R1909 Other intra-abdominal and pelvic swelling, mass and lump: Secondary | ICD-10-CM

## 2020-05-05 DIAGNOSIS — K4090 Unilateral inguinal hernia, without obstruction or gangrene, not specified as recurrent: Secondary | ICD-10-CM

## 2020-05-05 NOTE — Telephone Encounter (Signed)
CT ordered. 

## 2020-05-05 NOTE — Addendum Note (Signed)
Addended by: Birdie Sons, Aarna Mihalko G on: 05/05/2020 03:00 PM   Modules accepted: Orders

## 2020-05-05 NOTE — Telephone Encounter (Signed)
Pt called to discuss lab results. He agrees to the CT scan and would like Dr. Birdie Sons to place the orders.

## 2020-05-06 ENCOUNTER — Other Ambulatory Visit: Payer: Self-pay | Admitting: Family Medicine

## 2020-05-06 DIAGNOSIS — R39198 Other difficulties with micturition: Secondary | ICD-10-CM

## 2020-05-18 NOTE — Telephone Encounter (Signed)
Message sent to patient on mychart.  Klaire Court,cma

## 2020-05-19 ENCOUNTER — Other Ambulatory Visit: Payer: Self-pay

## 2020-05-19 ENCOUNTER — Ambulatory Visit
Admission: RE | Admit: 2020-05-19 | Discharge: 2020-05-19 | Disposition: A | Discharge status: Home / Self Care | Payer: Medicare Other | Source: Ambulatory Visit | Attending: Family Medicine | Admitting: Family Medicine

## 2020-05-19 DIAGNOSIS — R1909 Other intra-abdominal and pelvic swelling, mass and lump: Secondary | ICD-10-CM | POA: Insufficient documentation

## 2020-05-19 DIAGNOSIS — K4090 Unilateral inguinal hernia, without obstruction or gangrene, not specified as recurrent: Secondary | ICD-10-CM | POA: Diagnosis present

## 2020-05-19 LAB — POCT I-STAT CREATININE: Creatinine, Ser: 0.7 mg/dL (ref 0.61–1.24)

## 2020-05-19 MED ORDER — IOHEXOL 300 MG/ML  SOLN
100.0000 mL | Freq: Once | INTRAMUSCULAR | Status: AC | PRN
  Administered 2020-05-19: 100 mL via INTRAVENOUS

## 2020-05-22 ENCOUNTER — Other Ambulatory Visit: Payer: Self-pay

## 2020-05-22 ENCOUNTER — Ambulatory Visit (INDEPENDENT_AMBULATORY_CARE_PROVIDER_SITE_OTHER): Payer: Medicare Other | Admitting: Urology

## 2020-05-22 ENCOUNTER — Encounter: Payer: Self-pay | Admitting: Urology

## 2020-05-22 VITALS — BP 162/82 | HR 99 | Ht 72.0 in | Wt 185.0 lb

## 2020-05-22 DIAGNOSIS — R3911 Hesitancy of micturition: Secondary | ICD-10-CM | POA: Diagnosis not present

## 2020-05-22 DIAGNOSIS — N5089 Other specified disorders of the male genital organs: Secondary | ICD-10-CM

## 2020-05-22 DIAGNOSIS — N401 Enlarged prostate with lower urinary tract symptoms: Secondary | ICD-10-CM

## 2020-05-22 DIAGNOSIS — R3129 Other microscopic hematuria: Secondary | ICD-10-CM

## 2020-05-22 LAB — MICROSCOPIC EXAMINATION
Bacteria, UA: NONE SEEN
Epithelial Cells (non renal): NONE SEEN /hpf (ref 0–10)

## 2020-05-22 LAB — URINALYSIS, COMPLETE
Bilirubin, UA: NEGATIVE
Glucose, UA: NEGATIVE
Ketones, UA: NEGATIVE
Leukocytes,UA: NEGATIVE
Nitrite, UA: NEGATIVE
Protein,UA: NEGATIVE
Specific Gravity, UA: 1.025 (ref 1.005–1.030)
Urobilinogen, Ur: 0.2 mg/dL (ref 0.2–1.0)
pH, UA: 7 (ref 5.0–7.5)

## 2020-05-22 NOTE — Progress Notes (Signed)
05/22/2020 8:59 AM   Danny Navarro 1956/01/23 [id number]  Referring provider: Leone Haven, MD [address] [place],  [place] [address]  Chief Complaint  Patient presents with  . Other    HPI: Danny Navarro is a 64 y.o. male seen at request of Dr. Caryl Bis for evaluation of lower urinary tract symptoms.   Seen 04/28/2020 complaining of decreased urinary stream and difficulty urinating  UA with 2+ leukocytes on dipstick however no significant pyuria on microscopy  Was treated with an empiric course of Bactrim  Urine culture with no growth  Also noted a mass in the groin/perineal region; no pain and discovered incidentally  Ultrasound ordered which showed a possible right inguinal hernia  CT subsequently ordered which showed a right renal cyst and small hypodensities in the left kidney consistent with small renal cysts; bilateral nodular induration of the perineum was noted which is apparently a common finding among cyclists   Incidental small left inguinal hernia   PMH: Past Medical History:  Diagnosis Date  . Anxiety   . Hyperlipidemia   . Hypertension   . Screening 06/04/2008   GXT, walked 15:25. No CO or ECG changes    Surgical History: Past Surgical History:  Procedure Laterality Date  . [place]   x2    Home Medications:  Allergies as of 05/22/2020      Reactions   Other    Declomycin [demeclocycline] Rash      Medication List       Accurate as of May 22, 2020 11:59 PM. If you have any questions, ask your nurse or doctor.        STOP taking these medications   sulfamethoxazole-trimethoprim 800-160 MG tablet Commonly known as: BACTRIM DS Stopped by: Abbie Sons, MD     TAKE these medications   Coenzyme Q10 10 MG capsule Take by mouth.   ezetimibe 10 MG tablet Commonly known as: ZETIA TAKE ONE TABLET EVERY DAY   Multi-Vitamin tablet Take by mouth.   ramipril 5 MG capsule Commonly known as:  ALTACE TAKE 1 CAPSULE EVERY DAY   vitamin C 500 MG tablet Commonly known as: ASCORBIC ACID Take 500 mg by mouth daily.       Allergies:  Allergies  Allergen Reactions  . Other   . Declomycin [Demeclocycline] Rash    Family History: Family History  Problem Relation Age of Onset  . Coronary artery disease Mother   . Kidney disease Mother   . Heart disease Mother   . Heart attack Mother   . Cancer Father   . Multiple myeloma Father     Social History:  reports that he quit smoking about 41 years ago. He has never used smokeless tobacco. He reports that he does not drink alcohol and does not use drugs.   Physical Exam: BP (!) 162/82   Pulse 99   Ht 6' (1.829 m)   Wt 185 lb (83.9 kg)   BMI 25.09 kg/m   Constitutional:  Alert and oriented, No acute distress. HEENT: [place] AT, moist mucus membranes.  Trachea midline, no masses. Cardiovascular: No clubbing, cyanosis, or edema. Respiratory: Normal respiratory effort, no increased work of breathing. GI: Abdomen is soft, nontender, nondistended, no abdominal masses GU: Phallus without lesions, testes descended bilaterally without masses or tenderness. Prostate 45 g, smooth without nodules; nodular area palpable lateral aspect right perineum, nontender Skin: No rashes, bruises or suspicious lesions. Neurologic: Grossly intact, no focal deficits, moving  all 4 extremities. Psychiatric: Normal mood and affect.  Laboratory Data:  Urinalysis Dipstick trace blood Microscopy 3-10 RBC  Pertinent Imaging: CT images personally reviewed and interpreted   Assessment & Plan:    1. BPH with LUTS  Discussed this would be the etiology of his mild lower urinary tract symptoms  Symptoms not bothersome enough that he desires medication  2. 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 Left inguinal hernia  Incidental finding, asymptomatic  4. Microhematuria  AUA risk stratification:  High, based on age  Had CT abdomen/pelvis with contrast but not a CT urogram  We discussed the standard evaluation for microhematuria and recommend scheduling cystoscopy  May need CT urogram for persistent or worsening hematuria  Desire to proceed with cystoscopy   Abbie Sons, MD  [facility] [address], [place],  [address] [phone]

## 2020-05-23 ENCOUNTER — Encounter: Payer: Self-pay | Admitting: Urology

## 2020-05-29 ENCOUNTER — Other Ambulatory Visit: Payer: Self-pay

## 2020-05-29 ENCOUNTER — Ambulatory Visit (INDEPENDENT_AMBULATORY_CARE_PROVIDER_SITE_OTHER): Payer: Medicare Other | Admitting: Urology

## 2020-05-29 VITALS — BP 145/92 | HR 99 | Wt 185.0 lb

## 2020-05-29 DIAGNOSIS — R3129 Other microscopic hematuria: Secondary | ICD-10-CM | POA: Diagnosis not present

## 2020-05-29 LAB — URINALYSIS, COMPLETE
Bilirubin, UA: NEGATIVE
Glucose, UA: NEGATIVE
Ketones, UA: NEGATIVE
Leukocytes,UA: NEGATIVE
Nitrite, UA: NEGATIVE
Protein,UA: NEGATIVE
Specific Gravity, UA: 1.02 (ref 1.005–1.030)
Urobilinogen, Ur: 0.2 mg/dL (ref 0.2–1.0)
pH, UA: 7 (ref 5.0–7.5)

## 2020-05-29 LAB — MICROSCOPIC EXAMINATION
Bacteria, UA: NONE SEEN
Epithelial Cells (non renal): NONE SEEN /hpf (ref 0–10)
WBC, UA: NONE SEEN /hpf (ref 0–5)

## 2020-05-29 NOTE — Progress Notes (Signed)
   05/29/20  CC:  Chief Complaint  Patient presents with  . Cysto   Indications: Microhematuria  HPI: Refer to office note 05/22/2020.  No complaints today  Blood pressure (!) 145/92, pulse 99, weight 185 lb (83.9 kg). NED. A&Ox3.   No respiratory distress   Abd soft, NT, ND Normal phallus with bilateral descended testicles  Cystoscopy Procedure Note  Patient identification was confirmed, informed consent was obtained, and patient was prepped using Betadine solution.  Lidocaine jelly was administered per urethral meatus.     Pre-Procedure: - Inspection reveals a normal caliber urethral meatus.  Procedure: The flexible cystoscope was introduced without difficulty - No urethral strictures/lesions are present. - Moderate lateral lobe enlargement prostate  - Mild elevation bladder neck; "tight" prostatic urethra - Bilateral ureteral orifices identified - Bladder mucosa  reveals no ulcers, tumors, or lesions - No bladder stones - No trabeculation  Retroflexion shows no abnormality   Post-Procedure: - Patient tolerated the procedure well  Assessment/ Plan:  No bladder mucosal abnormalities on cystoscopy  Follow-up 6 months with repeat urinalysis    Riki Altes, MD

## 2020-05-30 ENCOUNTER — Encounter: Payer: Self-pay | Admitting: Urology

## 2020-07-08 ENCOUNTER — Other Ambulatory Visit: Payer: Self-pay | Admitting: Family Medicine

## 2020-09-28 DIAGNOSIS — M1711 Unilateral primary osteoarthritis, right knee: Secondary | ICD-10-CM | POA: Insufficient documentation

## 2020-10-12 ENCOUNTER — Other Ambulatory Visit: Payer: Self-pay

## 2020-10-16 ENCOUNTER — Ambulatory Visit (INDEPENDENT_AMBULATORY_CARE_PROVIDER_SITE_OTHER): Payer: Medicare Other | Admitting: Family Medicine

## 2020-10-16 ENCOUNTER — Encounter: Payer: Self-pay | Admitting: Family Medicine

## 2020-10-16 ENCOUNTER — Other Ambulatory Visit: Payer: Self-pay

## 2020-10-16 VITALS — BP 140/80 | HR 86 | Temp 98.2°F | Ht 72.0 in | Wt 186.0 lb

## 2020-10-16 DIAGNOSIS — E782 Mixed hyperlipidemia: Secondary | ICD-10-CM

## 2020-10-16 DIAGNOSIS — M25561 Pain in right knee: Secondary | ICD-10-CM

## 2020-10-16 DIAGNOSIS — Z125 Encounter for screening for malignant neoplasm of prostate: Secondary | ICD-10-CM | POA: Diagnosis not present

## 2020-10-16 DIAGNOSIS — I10 Essential (primary) hypertension: Secondary | ICD-10-CM

## 2020-10-16 LAB — COMPREHENSIVE METABOLIC PANEL
ALT: 21 U/L (ref 0–53)
AST: 16 U/L (ref 0–37)
Albumin: 4.5 g/dL (ref 3.5–5.2)
Alkaline Phosphatase: 64 U/L (ref 39–117)
BUN: 17 mg/dL (ref 6–23)
CO2: 29 mEq/L (ref 19–32)
Calcium: 9.6 mg/dL (ref 8.4–10.5)
Chloride: 105 mEq/L (ref 96–112)
Creatinine, Ser: 0.71 mg/dL (ref 0.40–1.50)
GFR: 96.13 mL/min (ref >60.00)
Glucose, Bld: 95 mg/dL (ref 70–99)
Potassium: 4.3 mEq/L (ref 3.5–5.1)
Sodium: 141 mEq/L (ref 135–145)
Total Bilirubin: 0.4 mg/dL (ref 0.2–1.2)
Total Protein: 6.5 g/dL (ref 6.0–8.3)

## 2020-10-16 LAB — LIPID PANEL
Cholesterol: 191 mg/dL (ref 0–200)
HDL: 44.9 mg/dL (ref >39.00)
LDL Cholesterol: 116 mg/dL — ABNORMAL HIGH (ref 0–99)
NonHDL: 146.1
Total CHOL/HDL Ratio: 4
Triglycerides: 149 mg/dL (ref 0.0–149.0)
VLDL: 29.8 mg/dL (ref 0.0–40.0)

## 2020-10-16 LAB — PSA, MEDICARE: PSA: 0.86 ng/ml (ref 0.10–4.00)

## 2020-10-16 MED ORDER — RAMIPRIL 5 MG PO CAPS: 5.0000 mg | ORAL_CAPSULE | Freq: Every day | ORAL | 2 refills | Status: DC

## 2020-10-16 MED ORDER — EZETIMIBE 10 MG PO TABS: 10.0000 mg | ORAL_TABLET | Freq: Every day | ORAL | 2 refills | Status: DC

## 2020-10-16 NOTE — Patient Instructions (Signed)
Nice to see you. We will get lab work today and contact you with the results. 

## 2020-10-16 NOTE — Progress Notes (Signed)
Tommi Rumps, MD Phone: [phone]  Danny Navarro is a 65 y.o. male who presents today for f/u.  HYPERTENSION  Disease Monitoring  Home BP Monitoring 110s/70s Chest pain- no    Dyspnea- no Medications  Compliance-  Taking ramipril.   Edema- no  HYPERLIPIDEMIA Symptoms Chest pain on exertion:  no   Medications: Compliance- taking zetia Right upper quadrant pain- no  Muscle aches- no  Right knee pain: Patient notes this started bothering him again recently.  He ended up seeing orthopedics and had an MRI that he reports had torn cartilage and just normal wear and tear for his age.  He had a Synvisc injection which he reports was quite beneficial.  Social History   Tobacco Use  Smoking Status Former Smoker  . Quit date: 06/20/1978  . Years since quitting: 42.3  Smokeless Tobacco Never Used    Current Outpatient Medications on File Prior to Visit  Medication Sig Dispense Refill  . Coenzyme Q10 10 MG capsule Take by mouth.    . meloxicam (MOBIC) 15 MG tablet Take 15 mg by mouth daily.    . Multiple Vitamin (MULTI-VITAMIN) tablet Take by mouth.    . vitamin C (ASCORBIC ACID) 500 MG tablet Take 500 mg by mouth daily.     Current Facility-Administered Medications on File Prior to Visit  Medication Dose Route Frequency Provider Last Rate Last Admin  . 0.9 %  sodium chloride infusion  500 mL Intravenous Continuous Armbruster, Carlota Raspberry, MD         ROS see history of present illness  Objective  Physical Exam Vitals:   10/16/20 0831  BP: 140/80  Pulse: 86  Temp: 98.2 F (36.8 C)  SpO2: 96%    BP Readings from Last 3 Encounters:  10/16/20 140/80  05/29/20 (!) 145/92  05/22/20 (!) 162/82   Wt Readings from Last 3 Encounters:  10/16/20 186 lb (84.4 kg)  05/29/20 185 lb (83.9 kg)  05/22/20 185 lb (83.9 kg)    Physical Exam Constitutional:      General: He is not in acute distress.    Appearance: He is not diaphoretic.  Cardiovascular:     Rate and Rhythm:  Normal rate and regular rhythm.     Heart sounds: Normal heart sounds.  Pulmonary:     Effort: Pulmonary effort is normal.     Breath sounds: Normal breath sounds.  Musculoskeletal:     Right lower leg: No edema.     Left lower leg: No edema.     Comments: Right knee with no swelling, warmth, erythema, or tenderness  Skin:    General: Skin is warm and dry.  Neurological:     Mental Status: He is alert.      Assessment/Plan: Please see individual problem list.  Problem List Items Addressed This Visit    Hyperlipidemia    Continue Zetia 10 mg once daily.  Check lipid panel.      Relevant Medications   ezetimibe (ZETIA) 10 MG tablet   ramipril (ALTACE) 5 MG capsule   Other Relevant Orders   Comp Met (CMET)   Lipid panel   HYPERTENSION, BENIGN - Primary    Adequately controlled at home.  He will continue ramipril 5 mg once daily and continue to monitor his blood pressure.  Lab work to be completed today.      Relevant Medications   ezetimibe (ZETIA) 10 MG tablet   ramipril (ALTACE) 5 MG capsule   Other Relevant Orders  Comp Met (CMET)   Right knee pain    Much improved following viscosupplementation through orthopedics.  He will monitor and follow-up with them as needed.       Other Visit Diagnoses    Prostate cancer screening       Relevant Orders   PSA, Medicare (  [facility] only)       Health Maintenance: PSA for prostate cancer screening to be completed today.  Patient declines COVID-vaccine and pneumonia vaccine.     This visit occurred during the SARS-CoV-2 public health emergency.  Safety protocols were in place, including screening questions prior to the visit, additional usage of staff PPE, and extensive cleaning of exam room while observing appropriate contact time as indicated for disinfecting solutions.    Tommi Rumps, MD [place]

## 2020-10-16 NOTE — Assessment & Plan Note (Signed)
Much improved following viscosupplementation through orthopedics.  He will monitor and follow-up with them as needed.

## 2020-10-16 NOTE — Assessment & Plan Note (Signed)
Continue Zetia 10 mg once daily.  Check lipid panel.

## 2020-10-16 NOTE — Assessment & Plan Note (Signed)
Adequately controlled at home.  He will continue ramipril 5 mg once daily and continue to monitor his blood pressure.  Lab work to be completed today.

## 2020-10-29 ENCOUNTER — Telehealth: Payer: Self-pay | Admitting: Family Medicine

## 2020-10-29 DIAGNOSIS — E785 Hyperlipidemia, unspecified: Secondary | ICD-10-CM

## 2020-10-29 NOTE — Telephone Encounter (Signed)
Pt called and wanted to discuss lab results and starting a cholesterol medication

## 2020-10-29 NOTE — Telephone Encounter (Signed)
The patient was willing to start a low dose of Crestor and it was never sent to the pharmacy, I did not send it because I was not sure of the dosage can you send to pharmacy for the patient to start.  Safi Culotta,cma

## 2020-10-30 MED ORDER — ROSUVASTATIN CALCIUM 5 MG PO TABS: 5.0000 mg | ORAL_TABLET | Freq: Every day | ORAL | 3 refills | Status: DC

## 2020-10-30 NOTE — Telephone Encounter (Signed)
I called the patient and spoke with him and  informed him that his Crestor was sent to pharmacy and if he has any muscle aches or joint pain to let us know, he understood.  Darel Ricketts,cma

## 2020-10-30 NOTE — Telephone Encounter (Signed)
I sent Crestor 5 mg once daily for him to take.  He needs labs in 6 weeks.  If he has any muscle aches or joint aches after starting this he should discontinue it and let us know.  Thanks.

## 2020-11-27 ENCOUNTER — Ambulatory Visit: Payer: Self-pay | Admitting: Urology

## 2020-11-30 ENCOUNTER — Other Ambulatory Visit (INDEPENDENT_AMBULATORY_CARE_PROVIDER_SITE_OTHER): Payer: Medicare Other

## 2020-11-30 ENCOUNTER — Other Ambulatory Visit: Payer: Self-pay

## 2020-11-30 DIAGNOSIS — I10 Essential (primary) hypertension: Secondary | ICD-10-CM | POA: Diagnosis not present

## 2020-11-30 DIAGNOSIS — E785 Hyperlipidemia, unspecified: Secondary | ICD-10-CM

## 2020-11-30 LAB — BASIC METABOLIC PANEL
BUN: 12 mg/dL (ref 6–23)
CO2: 27 mEq/L (ref 19–32)
Calcium: 9 mg/dL (ref 8.4–10.5)
Chloride: 104 mEq/L (ref 96–112)
Creatinine, Ser: 0.65 mg/dL (ref 0.40–1.50)
GFR: 98.65 mL/min (ref >60.00)
Glucose, Bld: 98 mg/dL (ref 70–99)
Potassium: 3.8 mEq/L (ref 3.5–5.1)
Sodium: 139 mEq/L (ref 135–145)

## 2020-11-30 LAB — HEPATIC FUNCTION PANEL
ALT: 21 U/L (ref 0–53)
AST: 14 U/L (ref 0–37)
Albumin: 4.3 g/dL (ref 3.5–5.2)
Alkaline Phosphatase: 58 U/L (ref 39–117)
Bilirubin, Direct: 0.1 mg/dL (ref 0.0–0.3)
Total Bilirubin: 0.5 mg/dL (ref 0.2–1.2)
Total Protein: 6.4 g/dL (ref 6.0–8.3)

## 2020-11-30 LAB — LDL CHOLESTEROL, DIRECT: Direct LDL: 92 mg/dL

## 2020-12-04 ENCOUNTER — Encounter: Payer: Self-pay | Admitting: Urology

## 2020-12-04 ENCOUNTER — Ambulatory Visit (INDEPENDENT_AMBULATORY_CARE_PROVIDER_SITE_OTHER): Payer: Medicare Other | Admitting: Urology

## 2020-12-04 ENCOUNTER — Other Ambulatory Visit: Payer: Self-pay

## 2020-12-04 ENCOUNTER — Telehealth: Payer: Self-pay | Admitting: *Deleted

## 2020-12-04 VITALS — BP 144/82 | HR 93 | Ht 72.0 in | Wt 180.0 lb

## 2020-12-04 DIAGNOSIS — R3129 Other microscopic hematuria: Secondary | ICD-10-CM | POA: Diagnosis not present

## 2020-12-04 LAB — MICROSCOPIC EXAMINATION
Bacteria, UA: NONE SEEN
Epithelial Cells (non renal): NONE SEEN /hpf (ref 0–10)

## 2020-12-04 LAB — URINALYSIS, COMPLETE
Bilirubin, UA: NEGATIVE
Glucose, UA: NEGATIVE
Ketones, UA: NEGATIVE
Leukocytes,UA: NEGATIVE
Nitrite, UA: NEGATIVE
Protein,UA: NEGATIVE
Specific Gravity, UA: 1.01 (ref 1.005–1.030)
Urobilinogen, Ur: 0.2 mg/dL (ref 0.2–1.0)
pH, UA: 6.5 (ref 5.0–7.5)

## 2020-12-04 NOTE — Progress Notes (Signed)
   12/04/2020 8:43 AM   [name] June 08, 1956 [id number]  Referring provider: Leone Haven, MD [address] [place],  [place] [address]  Chief Complaint  Patient presents with   Hematuria    Urologic history: 1.  Microhematuria (AUA high risk based on age) CT 04/2020 right renal cyst Cystoscopy 05/2020: Moderate lateral lobe enlargement   HPI: 65 y.o. male presents for 34-monthfollow-up.  No bothersome LUTS Denies gross hematuria No flank, abdominal or pelvic pain PSA 10/16/2020 0.86   PMH: Past Medical History:  Diagnosis Date   Anxiety    Hyperlipidemia    Hypertension    Screening 06/04/2008   GXT, walked 15:25. No CO or ECG changes    Surgical History: Past Surgical History:  Procedure Laterality Date   BACK SURGERY  1998   x2    Home Medications:  Allergies as of 12/04/2020       Reactions   Other    Declomycin [demeclocycline] Rash        Medication List        Accurate as of December 04, 2020  8:43 AM. If you have any questions, ask your nurse or doctor.          Coenzyme Q10 10 MG capsule Take by mouth.   ezetimibe 10 MG tablet Commonly known as: ZETIA Take 1 tablet (10 mg total) by mouth daily.   meloxicam 15 MG tablet Commonly known as: MOBIC Take 15 mg by mouth daily.   Multi-Vitamin tablet Take by mouth.   ramipril 5 MG capsule Commonly known as: ALTACE Take 1 capsule (5 mg total) by mouth daily.   rosuvastatin 5 MG tablet Commonly known as: Crestor Take 1 tablet (5 mg total) by mouth daily.   vitamin C 500 MG tablet Commonly known as: ASCORBIC ACID Take 500 mg by mouth daily.        Allergies:  Allergies  Allergen Reactions   Other    Declomycin [Demeclocycline] Rash    Family History: Family History  Problem Relation Age of Onset   Coronary artery disease Mother    Kidney disease Mother    Heart disease Mother    Heart attack Mother    Cancer Father    Multiple myeloma Father      Social History:  reports that he quit smoking about 42 years ago. He has never used smokeless tobacco. He reports that he does not drink alcohol and does not use drugs.   Physical Exam: BP (!) 144/82   Pulse 93   Ht 6' (1.829 m)   Wt 180 lb (81.6 kg)   BMI 24.41 kg/m   Constitutional:  Alert and oriented, No acute distress. HEENT: [place] AT, moist mucus membranes.  Trachea midline, no masses. Cardiovascular: No clubbing, cyanosis, or edema. Respiratory: Normal respiratory effort, no increased work of breathing. GU: Prostate 50 g, smooth without nodules Skin: No rashes, bruises or suspicious lesions. Neurologic: Grossly intact, no focal deficits, moving all 4 extremities. Psychiatric: Normal mood and affect.   Assessment & Plan:    1.  Asymptomatic microhematuria Urinalysis today shows no microscopic hematuria If Dr. SCaryl Biswill check an annual UA will see him back as needed  2.  BPH without LUTS   SAbbie Sons MD  [facility] [place] [phone]

## 2020-12-04 NOTE — Telephone Encounter (Signed)
Notified patient as instructed, Left message on voice mail per [facility]

## 2020-12-04 NOTE — Telephone Encounter (Signed)
-----   Message from Riki Altes, MD sent at 12/04/2020  9:03 AM EDT ----- Can let patient know his urinalysis showed no microscopic blood

## 2020-12-21 ENCOUNTER — Encounter: Payer: Self-pay | Admitting: Family Medicine

## 2020-12-23 NOTE — Telephone Encounter (Signed)
MyChart message sent  to patient; "ok to suspend crestor x 1 month and follow up with you

## 2021-02-19 DIAGNOSIS — M6701 Short Achilles tendon (acquired), right ankle: Secondary | ICD-10-CM | POA: Insufficient documentation

## 2021-02-19 DIAGNOSIS — M2040 Other hammer toe(s) (acquired), unspecified foot: Secondary | ICD-10-CM | POA: Insufficient documentation

## 2021-03-10 ENCOUNTER — Encounter: Payer: Self-pay | Admitting: Family Medicine

## 2021-03-22 ENCOUNTER — Telehealth: Payer: Self-pay | Admitting: Family Medicine

## 2021-03-22 DIAGNOSIS — E782 Mixed hyperlipidemia: Secondary | ICD-10-CM

## 2021-03-22 NOTE — Telephone Encounter (Signed)
Patient requesting labs to done to check cholesterol prior to an office visit with the provider. Needing orders and follow up office visit.

## 2021-03-22 NOTE — Telephone Encounter (Signed)
Labs ordered.

## 2021-03-23 NOTE — Telephone Encounter (Signed)
I called and LVM for the patient to call back to schedule a follow up visit with the provider and a lab appointment before that visit, labs are ordered.  Ivery Michalski,cma

## 2021-04-01 ENCOUNTER — Other Ambulatory Visit: Payer: Self-pay

## 2021-04-01 ENCOUNTER — Other Ambulatory Visit (INDEPENDENT_AMBULATORY_CARE_PROVIDER_SITE_OTHER): Payer: Medicare Other

## 2021-04-01 DIAGNOSIS — E782 Mixed hyperlipidemia: Secondary | ICD-10-CM | POA: Diagnosis not present

## 2021-04-01 LAB — COMPREHENSIVE METABOLIC PANEL
ALT: 26 U/L (ref 0–53)
AST: 18 U/L (ref 0–37)
Albumin: 4.6 g/dL (ref 3.5–5.2)
Alkaline Phosphatase: 67 U/L (ref 39–117)
BUN: 18 mg/dL (ref 6–23)
CO2: 28 mEq/L (ref 19–32)
Calcium: 9.6 mg/dL (ref 8.4–10.5)
Chloride: 104 mEq/L (ref 96–112)
Creatinine, Ser: 0.68 mg/dL (ref 0.40–1.50)
GFR: 97.08 mL/min (ref >60.00)
Glucose, Bld: 105 mg/dL — ABNORMAL HIGH (ref 70–99)
Potassium: 4.4 mEq/L (ref 3.5–5.1)
Sodium: 139 mEq/L (ref 135–145)
Total Bilirubin: 0.5 mg/dL (ref 0.2–1.2)
Total Protein: 6.7 g/dL (ref 6.0–8.3)

## 2021-04-01 LAB — LDL CHOLESTEROL, DIRECT: Direct LDL: 98 mg/dL

## 2021-04-23 ENCOUNTER — Ambulatory Visit: Payer: Medicare Other | Admitting: Family Medicine

## 2021-07-27 ENCOUNTER — Encounter: Payer: Self-pay | Admitting: Family Medicine

## 2021-07-27 ENCOUNTER — Telehealth: Payer: Self-pay | Admitting: Family Medicine

## 2021-07-27 DIAGNOSIS — I10 Essential (primary) hypertension: Secondary | ICD-10-CM

## 2021-07-27 NOTE — Telephone Encounter (Signed)
Can you call the patient and see what issues he is currently having with his BP medication?

## 2021-07-27 NOTE — Telephone Encounter (Signed)
Pt wife called about changing blood pressure medicine. Pt wife states that it is causing sinus issues. Pt wife wants one that does not go ace hibitor

## 2021-07-27 NOTE — Telephone Encounter (Signed)
LVM for patient to cal back. Danny Navarro,cma  

## 2021-07-30 MED ORDER — TELMISARTAN 40 MG PO TABS: 40.0000 mg | ORAL_TABLET | Freq: Every day | ORAL | 1 refills | Status: DC

## 2021-07-30 NOTE — Telephone Encounter (Signed)
This patient needs a visit with me in about 10 days.  If that is not possible please get him scheduled for nurse blood pressure check visit for his blood pressure.  He would need lab work the same day.  Order placed for labs.

## 2021-08-02 NOTE — Telephone Encounter (Signed)
LVM for patient to call back to schedule a visit with provider in 10 days. Lorenz Donley,cma

## 2021-08-03 NOTE — Telephone Encounter (Signed)
Patient called back and is scheduled for a lab appointment and nurse visit for BP.  Jamelle Noy,cma

## 2021-08-06 ENCOUNTER — Ambulatory Visit: Payer: Medicare Other | Admitting: *Deleted

## 2021-08-06 ENCOUNTER — Other Ambulatory Visit: Payer: Self-pay

## 2021-08-06 ENCOUNTER — Other Ambulatory Visit (INDEPENDENT_AMBULATORY_CARE_PROVIDER_SITE_OTHER): Payer: Medicare Other

## 2021-08-06 VITALS — BP 160/70 | HR 103

## 2021-08-06 DIAGNOSIS — I10 Essential (primary) hypertension: Secondary | ICD-10-CM

## 2021-08-06 LAB — BASIC METABOLIC PANEL
BUN: 15 mg/dL (ref 6–23)
CO2: 30 mEq/L (ref 19–32)
Calcium: 9.3 mg/dL (ref 8.4–10.5)
Chloride: 103 mEq/L (ref 96–112)
Creatinine, Ser: 0.59 mg/dL (ref 0.40–1.50)
GFR: 101.09 mL/min (ref >60.00)
Glucose, Bld: 104 mg/dL — ABNORMAL HIGH (ref 70–99)
Potassium: 4.3 mEq/L (ref 3.5–5.1)
Sodium: 138 mEq/L (ref 135–145)

## 2021-08-06 NOTE — Progress Notes (Addendum)
Patient here for nurse visit BP check per order from mychart message on 07/27/21  Patient reports compliance with prescribed BP medications: yes, Telmisartan 40 mg once daily by mouth nightly pt takes around 7 pm per patient.  Last dose of BP medication: 08/05/21 @7pm   BP Readings from Last 3 Encounters:  08/06/21 (!) 160/70  12/04/20 (!) 144/82  10/16/20 140/80   Pulse Readings from Last 3 Encounters:  08/06/21 (!) 103  12/04/20 93  10/16/20 86   We did go ahead and repeated his BP in his R arm 15 minutes post 1st check. 150/80, Pulse 96.    Patient verbalized understanding of instructions.   10/18/20 [place], [place]

## 2021-08-26 ENCOUNTER — Other Ambulatory Visit: Payer: Self-pay

## 2021-08-26 ENCOUNTER — Ambulatory Visit (INDEPENDENT_AMBULATORY_CARE_PROVIDER_SITE_OTHER): Payer: Medicare Other | Admitting: Dermatology

## 2021-08-26 DIAGNOSIS — L570 Actinic keratosis: Secondary | ICD-10-CM

## 2021-08-26 DIAGNOSIS — L578 Other skin changes due to chronic exposure to nonionizing radiation: Secondary | ICD-10-CM

## 2021-08-26 MED ORDER — FLUOROURACIL 5 % EX CREA: TOPICAL_CREAM | Freq: Two times a day (BID) | CUTANEOUS | 0 refills | Status: DC

## 2021-08-26 NOTE — Patient Instructions (Signed)

## 2021-08-26 NOTE — Progress Notes (Unsigned)
° °  Follow-Up Visit   Subjective  Danny Navarro is a 66 y.o. male who presents for the following: Other (Spot on scalp that gets scaly).    The following portions of the chart were reviewed this encounter and updated as appropriate:       Review of Systems:  No other skin or systemic complaints except as noted in HPI or Assessment and Plan.  Objective  Well appearing patient in no apparent distress; mood and affect are within normal limits.  A focused examination was performed including scalp, face, ears. Relevant physical exam findings are noted in the Assessment and Plan.  Scalp, face, ears (11) Erythematous thin papules/macules with gritty scale.     Assessment & Plan  AK (actinic keratosis) (11) Scalp, face, ears  Acanthosis nigricans is a chronic localized skin disorder manifesting with hyperpigmented, velvety plaques typically located in flexural/body fold regions. It is most commonly associated with obesity and is linked to type 2 diabetes, insulin resistance, and metabolic syndrome.  Rarely it can be associated with other systemic causes.  In 4 weeks, start Fluorouracil 5%/Calcipotriene cream bid x 1 week to scalp, forehead, temples and ears  fluorouracil (EFUDEX) 5 % cream - Scalp, face, ears Apply topically 2 (two) times daily. Apply for 1 week to scalp, forehead, temples and ears   Return in about 4 months (around 12/26/2021) for AK follow up.  I, Joanie Coddington, CMA, am acting as scribe for Armida Sans, MD .

## 2021-09-01 ENCOUNTER — Encounter: Payer: Self-pay | Admitting: Dermatology

## 2021-09-21 ENCOUNTER — Encounter: Payer: Medicare Other | Admitting: Internal Medicine

## 2021-10-13 ENCOUNTER — Ambulatory Visit (INDEPENDENT_AMBULATORY_CARE_PROVIDER_SITE_OTHER): Payer: Medicare Other | Admitting: Internal Medicine

## 2021-10-13 ENCOUNTER — Encounter: Payer: Self-pay | Admitting: Internal Medicine

## 2021-10-13 VITALS — BP 140/100 | HR 90 | Temp 98.3°F | Ht 72.0 in | Wt 180.0 lb

## 2021-10-13 DIAGNOSIS — R5383 Other fatigue: Secondary | ICD-10-CM | POA: Diagnosis not present

## 2021-10-13 DIAGNOSIS — R1909 Other intra-abdominal and pelvic swelling, mass and lump: Secondary | ICD-10-CM

## 2021-10-13 DIAGNOSIS — E782 Mixed hyperlipidemia: Secondary | ICD-10-CM | POA: Diagnosis not present

## 2021-10-13 DIAGNOSIS — I7121 Aneurysm of the ascending aorta, without rupture: Secondary | ICD-10-CM

## 2021-10-13 DIAGNOSIS — I10 Essential (primary) hypertension: Secondary | ICD-10-CM | POA: Diagnosis not present

## 2021-10-13 DIAGNOSIS — K4090 Unilateral inguinal hernia, without obstruction or gangrene, not specified as recurrent: Secondary | ICD-10-CM

## 2021-10-13 DIAGNOSIS — M47816 Spondylosis without myelopathy or radiculopathy, lumbar region: Secondary | ICD-10-CM

## 2021-10-13 LAB — CBC WITH DIFFERENTIAL/PLATELET
Basophils Absolute: 0 10*3/uL (ref 0.0–0.1)
Basophils Relative: 0.9 % (ref 0.0–3.0)
Eosinophils Absolute: 0.1 10*3/uL (ref 0.0–0.7)
Eosinophils Relative: 2.4 % (ref 0.0–5.0)
HCT: 43.7 % (ref 39.0–52.0)
Hemoglobin: 14.9 g/dL (ref 13.0–17.0)
Lymphocytes Relative: 37.7 % (ref 12.0–46.0)
Lymphs Abs: 1.8 10*3/uL (ref 0.7–4.0)
MCHC: 34.2 g/dL (ref 30.0–36.0)
MCV: 90.3 fl (ref 78.0–100.0)
Monocytes Absolute: 0.5 10*3/uL (ref 0.1–1.0)
Monocytes Relative: 10.5 % (ref 3.0–12.0)
Neutro Abs: 2.4 10*3/uL (ref 1.4–7.7)
Neutrophils Relative %: 48.5 % (ref 43.0–77.0)
Platelets: 264 10*3/uL (ref 150.0–400.0)
RBC: 4.84 Mil/uL (ref 4.22–5.81)
RDW: 13.7 % (ref 11.5–15.5)
WBC: 4.9 10*3/uL (ref 4.0–10.5)

## 2021-10-13 LAB — COMPREHENSIVE METABOLIC PANEL
ALT: 25 U/L (ref 0–53)
AST: 19 U/L (ref 0–37)
Albumin: 4.6 g/dL (ref 3.5–5.2)
Alkaline Phosphatase: 62 U/L (ref 39–117)
BUN: 18 mg/dL (ref 6–23)
CO2: 27 mEq/L (ref 19–32)
Calcium: 9.4 mg/dL (ref 8.4–10.5)
Chloride: 103 mEq/L (ref 96–112)
Creatinine, Ser: 0.65 mg/dL (ref 0.40–1.50)
GFR: 98.05 mL/min (ref >60.00)
Glucose, Bld: 101 mg/dL — ABNORMAL HIGH (ref 70–99)
Potassium: 4.6 mEq/L (ref 3.5–5.1)
Sodium: 138 mEq/L (ref 135–145)
Total Bilirubin: 0.5 mg/dL (ref 0.2–1.2)
Total Protein: 6.7 g/dL (ref 6.0–8.3)

## 2021-10-13 LAB — LIPID PANEL
Cholesterol: 174 mg/dL (ref 0–200)
HDL: 40.5 mg/dL (ref >39.00)
NonHDL: 133.37
Total CHOL/HDL Ratio: 4
Triglycerides: 224 mg/dL — ABNORMAL HIGH (ref 0.0–149.0)
VLDL: 44.8 mg/dL — ABNORMAL HIGH (ref 0.0–40.0)

## 2021-10-13 LAB — MICROALBUMIN / CREATININE URINE RATIO
Creatinine,U: 60.7 mg/dL
Microalb Creat Ratio: 1.2 mg/g (ref 0.0–30.0)
Microalb, Ur: <0.7 mg/dL (ref 0.0–1.9)

## 2021-10-13 LAB — LDL CHOLESTEROL, DIRECT: Direct LDL: 80 mg/dL

## 2021-10-13 MED ORDER — TETANUS-DIPHTH-ACELL PERTUSSIS 5-2.5-18.5 LF-MCG/0.5 IM SUSY: 0.5000 mL | PREFILLED_SYRINGE | Freq: Once | INTRAMUSCULAR | 0 refills | Status: AC

## 2021-10-13 NOTE — Assessment & Plan Note (Signed)
He has not had follow up since 2019.  Referring back to Dr Mariah Milling for ECHO ?

## 2021-10-13 NOTE — Assessment & Plan Note (Signed)
Noted on 2021 CT  ?

## 2021-10-13 NOTE — Assessment & Plan Note (Signed)
Bilateral peroneal induration noted on CT common in cycling athletes/ ?

## 2021-10-13 NOTE — Assessment & Plan Note (Signed)
With 2 prior diskectomies for L4-5 disk herniation  ?

## 2021-10-13 NOTE — Assessment & Plan Note (Addendum)
With coronary calcium score > 200 and strong FH of CAD.   Marland Kitchen  LDL is 80 without Zetia . Goal is LDL < 70  Will recommend resuming therapy  ?  ? ?Lab Results  ?Component Value Date  ? CHOL 174 10/13/2021  ? HDL 40.50 10/13/2021  ? LDLCALC 116 (H) 10/16/2020  ? LDLDIRECT 80.0 10/13/2021  ? TRIG 224.0 (H) 10/13/2021  ? CHOLHDL 4 10/13/2021  ? ? ?

## 2021-10-13 NOTE — Patient Instructions (Addendum)
NSAIDs (meloxicam ,  advil, aleve)  can elevate your blood PRESSURE.  TYLENOL WILL NOT ? ? ?Suspend the meloxicam for 48 hours and recheck BP at home WHEN AT REST AND CALM ? ? ?You can take up to 2000 mg of acetominophen (tylenol) every day safely  In divided doses (500 mg every 6 hours  Or 1000 mg every 12 hours.)  ? ? ? ? ?The Tdap (tetanus-diphtheria-whooping cough vaccine ) is now  COVERED BY MEDICARE if you get them at your pharmacy as of  January 1.  ? ?You're 3 days early for the PSA test so we'll do it in October  ?

## 2021-10-13 NOTE — Assessment & Plan Note (Signed)
Elevation noted , with home readings improved. urine microalb/cr ratio today normal;  will treat more aggressively with goal is < 120/70 given aortic aneurysm ? ?Lab Results  ?Component Value Date  ? LABMICR See below: 12/04/2020  ? LABMICR See below: 05/29/2020  ? MICROALBUR <0.7 10/13/2021  ? ? ? ?

## 2021-10-13 NOTE — Progress Notes (Signed)
? ?Subjective:  ?Patient ID: Danny KatoMark K Navarro, male    DOB: 11/08/1955  Age: 66 y.o. MRN: [id number] ? ?CC: The primary encounter diagnosis was Primary hypertension. Diagnoses of Mixed hyperlipidemia, Other fatigue, Aneurysm of ascending aorta without rupture (HCC), HYPERTENSION, BENIGN, Lumbar spondylosis, Nodule of groin, and Indirect left inguinal hernia were also pertinent to this visit. ? ? ?This visit occurred during the SARS-CoV-2 public health emergency.  Safety protocols were in place, including screening questions prior to the visit, additional usage of staff PPE, and extensive cleaning of exam room while observing appropriate contact time as indicated for disinfecting solutions.   ? ?HPI ?Danny KatoMark K Navarro presents for  ?Chief Complaint  ?Patient presents with  ? Transitions Of Care  ? ?HTN:  Patient is taking his telmsartan 40 mg  as prescribed and notes no adverse effects.  Home BP readings have been done about once per week and are  generally < 130/80 .  he is avoiding added salt in his diet and bicycles long distance several days per week for exercise.  He takes meloxicam prn back pain and has taken it daily for the last 5 days ? ?History of 2  lumbar spine diskectomies L4-5  first in 1993   2) 1998 both by  Dr Elesa Hackereaton  ? ? ? ? ?Outpatient Medications Prior to Visit  ?Medication Sig Dispense Refill  ? Coenzyme Q10 10 MG capsule Take by mouth.    ? meloxicam (MOBIC) 15 MG tablet Take 15 mg by mouth daily.    ? Multiple Vitamin (MULTI-VITAMIN) tablet Take by mouth.    ? rosuvastatin (CRESTOR) 5 MG tablet Take 1 tablet (5 mg total) by mouth daily. 90 tablet 3  ? telmisartan (MICARDIS) 40 MG tablet Take 1 tablet (40 mg total) by mouth daily. 90 tablet 1  ? vitamin C (ASCORBIC ACID) 500 MG tablet Take 500 mg by mouth daily.    ? ezetimibe (ZETIA) 10 MG tablet Take 1 tablet (10 mg total) by mouth daily. (Patient not taking: Reported on 10/13/2021) 90 tablet 2  ? fluorouracil (EFUDEX) 5 % cream Apply topically 2 (two)  times daily. Apply for 1 week to scalp, forehead, temples and ears (Patient not taking: Reported on 10/13/2021) 15 g 0  ? 0.9 %  sodium chloride infusion     ? ?No facility-administered medications prior to visit.  ? ? ?Review of Systems; ? ?Patient denies headache, fevers, malaise, unintentional weight loss, skin rash, eye pain, sinus congestion and sinus pain, sore throat, dysphagia,  hemoptysis , cough, dyspnea, wheezing, chest pain, palpitations, orthopnea, edema, abdominal pain, nausea, melena, diarrhea, constipation, flank pain, dysuria, hematuria, urinary  Frequency, nocturia, numbness, tingling, seizures,  Focal weakness, Loss of consciousness,  Tremor, insomnia, depression, anxiety, and suicidal ideation.   ? ? ? ?Objective:  ?BP (!) 140/100 (BP Location: Left Arm, Patient Position: Sitting, Cuff Size: Small)   Pulse 90   Temp 98.3 ?F (36.8 ?C) (Temporal)   Ht 6' (1.829 m)   Wt 180 lb (81.6 kg)   SpO2 98%   BMI 24.41 kg/m?  ? ?BP Readings from Last 3 Encounters:  ?10/13/21 (!) 140/100  ?08/06/21 (!) 160/70  ?12/04/20 (!) 144/82  ? ? ?Wt Readings from Last 3 Encounters:  ?10/13/21 180 lb (81.6 kg)  ?12/04/20 180 lb (81.6 kg)  ?10/16/20 186 lb (84.4 kg)  ? ? ?General appearance: alert, cooperative and appears stated age ?Ears: normal TM's and external ear canals both ears ?Throat: lips, mucosa, and  tongue normal; teeth and gums normal ?Neck: no adenopathy, no carotid bruit, supple, symmetrical, trachea midline and thyroid not enlarged, symmetric, no tenderness/mass/nodules ?Back: symmetric, no curvature. ROM normal. No CVA tenderness. ?Lungs: clear to auscultation bilaterally ?Heart: regular rate and rhythm, S1, S2 normal, no murmur, click, rub or gallop ?Abdomen: soft, non-tender; bowel sounds normal; no masses,  no organomegaly ?Pulses: 2+ and symmetric ?Skin: Skin color, texture, turgor normal. No rashes or lesions ?Lymph nodes: Cervical, supraclavicular, and axillary nodes normal. ? ?Lab Results   ?Component Value Date  ? HGBA1C 5.6 04/16/2019  ? ? ?Lab Results  ?Component Value Date  ? CREATININE 0.65 10/13/2021  ? CREATININE 0.59 08/06/2021  ? CREATININE 0.68 04/01/2021  ? ? ?Lab Results  ?Component Value Date  ? WBC 4.9 10/13/2021  ? HGB 14.9 10/13/2021  ? HCT 43.7 10/13/2021  ? PLT 264.0 10/13/2021  ? GLUCOSE 101 (H) 10/13/2021  ? CHOL 174 10/13/2021  ? TRIG 224.0 (H) 10/13/2021  ? HDL 40.50 10/13/2021  ? LDLDIRECT 80.0 10/13/2021  ? LDLCALC 116 (H) 10/16/2020  ? ALT 25 10/13/2021  ? AST 19 10/13/2021  ? NA 138 10/13/2021  ? K 4.6 10/13/2021  ? CL 103 10/13/2021  ? CREATININE 0.65 10/13/2021  ? BUN 18 10/13/2021  ? CO2 27 10/13/2021  ? TSH 1.31 04/02/2018  ? PSA 0.86 10/16/2020  ? HGBA1C 5.6 04/16/2019  ? MICROALBUR <0.7 10/13/2021  ? ? ?CT Abdomen Pelvis W Contrast ? ?Result Date: 05/19/2020 ?CLINICAL DATA:  Possible inguinal hernia on CT. Possible bump along the right posterior buttock over the last 3-4 weeks. EXAM: CT ABDOMEN AND PELVIS WITH CONTRAST TECHNIQUE: Multidetector CT imaging of the abdomen and pelvis was performed using the standard protocol following bolus administration of intravenous contrast. CONTRAST:  OMNIPAQUE IOHEXOL 300 MG/ML  SOLN COMPARISON:  Ultrasound of 04/29/2020 FINDINGS: Lower chest: Unremarkable Hepatobiliary: Unremarkable Pancreas: Unremarkable Spleen: Unremarkable Adrenals/Urinary Tract: Fluid density 3.5 by 3.3 cm right kidney upper pole lesion compatible with cyst. Smaller hypodense lesions in the left kidney are technically too small to characterize although probably cysts. Urinary bladder unremarkable. Stomach/Bowel: Unremarkable Vascular/Lymphatic: Aortoiliac atherosclerotic vascular disease. Reproductive: Unremarkable Other: There is bilateral perineal nodular induration, manifesting as a thin band in the subcutaneous tissues of the left perineum (as on image 96 of series 2 and as on image 82 of series 4) and a thicker band with more substantial nodularity in  the subcutaneous tissues of the right perineum as on image 96 of series 2 and image 67 of series 4. Along the right peroneal nodularity, the thickening measures 1.2 cm whereas along the left side the thickening measures up to about 0.4 cm. This is classically associated with cycling athletes. Musculoskeletal: Small indirect left inguinal hernia containing adipose tracking along the spermatic cord. Currently no herniated bowel. The small fatty hernias shown on images 44 through 36 of series 4. Grade 1 degenerative retrolisthesis at L4-5, with lumbar spondylosis and degenerative disc disease causing mild bilateral foraminal impingement at L4-5 and L5-S1. IMPRESSION: 1. Small indirect left inguinal hernia containing adipose tracking along the spermatic cord. 2. Bilateral perineal nodular induration, more substantially nodular on the right side than the left. This condition is classically associated with cycling athletes. 3. Other imaging findings of potential clinical significance: Right kidney upper pole cyst. Lumbar spondylosis and degenerative disc disease causing mild bilateral foraminal impingement at L4-5 and L5-S1. 4. Aortic atherosclerosis. Aortic Atherosclerosis (ICD10-I70.0). Electronically Signed   By: Annitta Needs.D.  On: 05/19/2020 14:30  ? ? ?Assessment & Plan:  ? ?Problem List Items Addressed This Visit   ? ? Aneurysm of ascending aorta without rupture (HCC)  ?  He has not had follow up since 2019.  Referring back to Dr Mariah Milling for ECHO ? ?  ?  ? Relevant Orders  ? Ambulatory referral to Cardiology  ? Hyperlipidemia  ?  With coronary calcium score > 200 and strong FH of CAD.   Marland Kitchen  LDL is 80 without Zetia . Goal is LDL < 70  Will recommend resuming therapy  ?  ? ?Lab Results  ?Component Value Date  ? CHOL 174 10/13/2021  ? HDL 40.50 10/13/2021  ? LDLCALC 116 (H) 10/16/2020  ? LDLDIRECT 80.0 10/13/2021  ? TRIG 224.0 (H) 10/13/2021  ? CHOLHDL 4 10/13/2021  ? ? ?  ?  ? Relevant Orders  ? Lipid Profile  (Completed)  ? LDL cholesterol, direct (Completed)  ? HYPERTENSION, BENIGN  ?  Elevation noted , with home readings improved. urine microalb/cr ratio today normal;  will treat more aggressively with goal is <

## 2021-11-02 ENCOUNTER — Ambulatory Visit (INDEPENDENT_AMBULATORY_CARE_PROVIDER_SITE_OTHER): Payer: Medicare Other

## 2021-11-02 VITALS — BP 114/65 | Ht 72.0 in | Wt 180.0 lb

## 2021-11-02 DIAGNOSIS — Z0000 Encounter for general adult medical examination without abnormal findings: Secondary | ICD-10-CM | POA: Diagnosis not present

## 2021-11-02 NOTE — Progress Notes (Addendum)
Subjective:   Danny Navarro is a 66 y.o. male who presents for an Initial Medicare Annual Wellness Visit.  Review of Systems    No ROS.  Medicare Wellness Virtual Visit.  Visual/audio telehealth visit, UTA vital signs.   See social history for additional risk factors.   Cardiac Risk Factors include: advanced age (>59mn, >>54women);male gender;hypertension     Objective:    Today's Vitals   11/02/21 1241  BP: 114/65  Weight: 180 lb (81.6 kg)  Height: 6' (1.829 m)   Body mass index is 24.41 kg/m.     11/02/2021   12:43 PM 04/12/2016    7:18 AM  Advanced Directives  Does Patient Have a Medical Advance Directive? Yes Yes  Type of AParamedicof [place] will  Does patient want to make changes to medical advance directive? No - Patient declined   Copy of HStewartin Chart? No - copy requested     Current Medications (verified) Outpatient Encounter Medications as of 11/02/2021  Medication Sig   Coenzyme Q10 10 MG capsule Take by mouth.   ezetimibe (ZETIA) 10 MG tablet Take 1 tablet (10 mg total) by mouth daily. (Patient not taking: Reported on 10/13/2021)   fluorouracil (EFUDEX) 5 % cream Apply topically 2 (two) times daily. Apply for 1 week to scalp, forehead, temples and ears (Patient not taking: Reported on 10/13/2021)   meloxicam (MOBIC) 15 MG tablet Take 15 mg by mouth daily.   Multiple Vitamin (MULTI-VITAMIN) tablet Take by mouth.   rosuvastatin (CRESTOR) 5 MG tablet Take 1 tablet (5 mg total) by mouth daily.   telmisartan (MICARDIS) 40 MG tablet Take 1 tablet (40 mg total) by mouth daily.   vitamin C (ASCORBIC ACID) 500 MG tablet Take 500 mg by mouth daily.   No facility-administered encounter medications on file as of 11/02/2021.    Allergies (verified) Other and Declomycin [demeclocycline]   History: Past Medical History:  Diagnosis Date   Anxiety    Hyperlipidemia     Hypertension    Screening 06/04/2008   GXT, walked 15:25. 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  Tobacco Use   Smoking status: Former    Types: Cigarettes    Quit date: 06/20/1978    Years since quitting: 43.4   Smokeless tobacco: Never  Vaping Use   Vaping Use: Never used  Substance and Sexual Activity   Alcohol use: No   Drug use: No   Sexual activity: Not on file  Other Topics Concern   Not on file  Social History Narrative   Married with 2 kids   Owns his own business   Gets regular exercise   Social Determinants of HRadio broadcast assistant  Strain: Low Risk    Difficulty of Paying Living Expenses: Not hard at all  Food Insecurity: No Food Insecurity   Worried About Charity fundraiser in the Last Year: Never true   Ran Out of Food in the Last Year: Never true  Transportation Needs: No Transportation Needs   Lack of Transportation (Medical): No   Lack of Transportation (Non-Medical): No  Physical Activity: Sufficiently Active   Days of Exercise per Week: 3 days   Minutes of Exercise per Session: 60 min  Stress: No Stress Concern Present   Feeling of Stress : Not at all  Social Connections: Unknown   Frequency of Communication with Friends and Family: More than three times a week   Frequency of Social Gatherings with Friends and Family: More than three times a week   Attends Religious Services: Not on Music therapist or Organizations: Not on file   Attends Archivist Meetings: Not on file   Marital Status: Married    Tobacco Counseling Counseling given: Not Answered   Clinical Intake:  Pre-visit preparation completed: Yes        Diabetes: No  How often do you need to have someone help you when you read instructions, pamphlets, or other written materials from your doctor or pharmacy?: 1 - Never  Interpreter Needed?: No      Activities of Daily Living    11/02/2021   12:44 PM  In your present state of health, do you have any difficulty performing the following activities:  Hearing? 0  Vision? 0  Difficulty concentrating or making decisions? 0  Walking or climbing stairs? 0  Dressing or bathing? 0  Doing errands, shopping? 0  Preparing Food and eating ? N  Using the Toilet? N  In the past six months, have you accidently leaked urine? N  Do you have problems with loss of bowel control? N  Managing your Medications? N  Managing your Finances? N  Housekeeping or managing your Housekeeping? N    Patient Care Team: Crecencio Mc, MD as PCP - General (Internal Medicine)  Indicate any recent Medical Services you may have received from other than [facility] providers in the past year (date may be approximate).     Assessment:   This is a routine wellness examination for Danny Navarro.  Virtual Visit via Telephone Note  I connected with  Danny Navarro on 11/02/21 at 12:30 PM EDT by telephone and verified that I am speaking with the correct person using two identifiers.  Persons participating in the virtual visit: patient/Nurse Health Advisor   I discussed the limitations of performing an evaluation and management service by telehealth. We continued and completed visit with audio only. Some vital signs may be absent or patient reported.   Hearing/Vision screen Hearing Screening - Comments:: Patient is able to hear conversational tones without difficulty.  No issues  reported. Vision Screening - Comments:: Followed by [facility].  Wears corrective lenses They have seen their ophthalmologist in the last 12 months.    Dietary issues and exercise activities discussed: Current Exercise Habits: Structured exercise class, Type of exercise: calisthenics, Time (Minutes): 60, Frequency (Times/Week): 3, Weekly Exercise (Minutes/Week): 180, Intensity: Moderate Healthy diet Good water intake   Goals Addressed             This Visit's Progress    Maintain Healthy Lifestyle       Stay active Stay hydrated  Depression Screen    11/02/2021   12:44 PM 10/13/2021    8:02 AM 10/16/2020    8:32 AM 10/15/2019    8:11 AM 04/16/2019    8:34 AM 04/02/2018    8:28 AM 09/29/2017    9:31 AM  PHQ 2/9 Scores  PHQ - 2 Score 0 0 0 0 0 0 0    Fall Risk    11/02/2021   12:43 PM 10/13/2021    8:02 AM 10/16/2020    8:32 AM 04/28/2020   10:44 AM 10/15/2019    8:10 AM  Fall Risk   Falls in the past year? 0 0 0 0 0  Number falls in past yr: 0  0  0  Risk for fall due to :  No Fall Risks     Follow up _0     FALL RISK PREVENTION PERTAINING TO THE HOME:  Home free of loose throw rugs in walkways, pet beds, electrical cords, etc? Yes  Adequate lighting in your home to reduce risk of falls? Yes   ASSISTIVE DEVICES UTILIZED TO PREVENT FALLS: Life alert? No  Use of a cane, walker or w/c? No   TIMED UP AND GO: Was the test performed? No .   Cognitive Function:  Patient is alert and oriented x3.      Immunizations Immunization History  Administered Date(s) Administered   Tdap 06/02/2010   Covid vaccine- discontinued per patient preference.   Screening Tests Health Maintenance  Topic Date Due   Zoster Vaccines- Shingrix (1 of 2) 01/12/2022 (Originally 01/24/1975)   Pneumonia Vaccine 75+ Years old (1 - PCV)  10/14/2022 (Originally 01/25/2020)   INFLUENZA VACCINE  01/18/2022   TETANUS/TDAP  11/30/2023   COLONOSCOPY (Pts 45-10yr Insurance coverage will need to be confirmed)  04/12/2026   Hepatitis C Screening  Completed   HPV VACCINES  Aged Out   COVID-19 Vaccine  Discontinued   Health Maintenance There are no preventive care reminders to display for this patient.  Lung Cancer Screening: (Low Dose CT Chest recommended if Age 67-80years, 30 pack-year currently smoking OR have quit w/in 15years.) does not qualify.   Vision Screening: Recommended annual ophthalmology exams for early detection of glaucoma and other disorders of the eye.  Dental Screening: Recommended annual dental exams for proper oral hygiene  Community Resource Referral / Chronic Care Management: CRR required this visit?  No   CCM required this visit?  No      Plan:   Keep all routine maintenance appointments.   I have personally reviewed and noted the following in the patient's chart:   Medical and social history Use of alcohol, tobacco or illicit drugs  Current medications and supplements including opioid prescriptions. Patient is not currently taking opioid prescriptions. Functional ability and status Nutritional status Physical activity Advanced directives List of other physicians Hospitalizations, surgeries, and ER visits in previous 12 months Vitals Screenings to include cognitive, depression, and falls Referrals and appointments  In addition, I have reviewed and discussed with patient certain preventive protocols, quality metrics, and best practice recommendations. A written personalized care plan for preventive services as well as general preventive health recommendations were provided to patient.     OBrien-Blaney, Kaileb Navarro L, LPN   54/16/3845    I have reviewed the above information and agree with above.   TDeborra Medina MD

## 2021-11-02 NOTE — Patient Instructions (Addendum)
?  Danny Navarro , ?Thank you for taking time to come for your Medicare Wellness Visit. I appreciate your ongoing commitment to your health goals. Please review the following plan we discussed and let me know if I can assist you in the future.  ? ?These are the goals we discussed: ? Goals   ? ?  Maintain Healthy Lifestyle   ?  Stay active ?Stay hydrated  ?  ? ?  ?  ?This is a list of the screening recommended for you and due dates:  ?Health Maintenance  ?Topic Date Due  ? Zoster (Shingles) Vaccine (1 of 2) 01/12/2022*  ? Pneumonia Vaccine (1 - PCV) 10/14/2022*  ? Flu Shot  01/18/2022  ? Tetanus Vaccine  11/30/2023  ? Colon Cancer Screening  04/12/2026  ? Hepatitis C Screening: USPSTF Recommendation to screen - Ages 65-79 yo.  Completed  ? HPV Vaccine  Aged Out  ? COVID-19 Vaccine  Discontinued  ?*Topic was postponed. The date shown is not the original due date.  ?  ?

## 2021-11-03 ENCOUNTER — Other Ambulatory Visit: Payer: Self-pay | Admitting: Family Medicine

## 2021-11-04 ENCOUNTER — Encounter: Payer: Self-pay | Admitting: Cardiovascular Disease

## 2021-11-04 ENCOUNTER — Ambulatory Visit (INDEPENDENT_AMBULATORY_CARE_PROVIDER_SITE_OTHER): Payer: Medicare Other | Admitting: Cardiovascular Disease

## 2021-11-04 VITALS — BP 172/90 | HR 98 | Ht 72.0 in | Wt 188.0 lb

## 2021-11-04 DIAGNOSIS — I77810 Thoracic aortic ectasia: Secondary | ICD-10-CM | POA: Diagnosis not present

## 2021-11-04 DIAGNOSIS — E782 Mixed hyperlipidemia: Secondary | ICD-10-CM | POA: Diagnosis not present

## 2021-11-04 DIAGNOSIS — I493 Ventricular premature depolarization: Secondary | ICD-10-CM

## 2021-11-04 DIAGNOSIS — I700 Atherosclerosis of aorta: Secondary | ICD-10-CM

## 2021-11-04 NOTE — Progress Notes (Signed)
Cardiology Office Note  Date:  11/04/2021   ID:  Danny Navarro, DOB 08/15/1955, MRN [id number]  PCP:  Navarro, Danny L, MD   Chief Complaint  Patient presents with   New Patient (Initial Visit)    Ref by PCP for Aneurysm of ascending aorta without rupture.     HPI:  [name] is a 66-year-old gentleman with past medical history of Hyperlipidemia PVCs Hypertension Aortic atherosclerosis on CT Mildly ascending aortic aneurysm measuring 42 mm Who presents for palpitations, PVCs, cardiac risk factors  Last seen by myself in clinic December 2019 Followed by Dr. 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  BP elevated today, anxious coming into the office today At home 115/65 at home, consistently well controlled Has 2 cuffs that he uses to check numbers  Ramapril changed to telmisartan recently  Exercises "alot", no chest pain on exertion,  able to bike 40 miles without symptoms  CT scan 2019 pulled up and reviewed on today's visit  detailing mild coronary calcification in the RCA, mildly dilated ascending aorta  EKG personally reviewed by myself on todays visit Nsr 91 bpm no significant ST-T wave changes  Family hx Mom was smoker, CAD Dad died of cancer  EKG personally reviewed by myself on todays visit Shows normal sinus rhythm with no significant ST or T wave changes rate 100 bpm  Blood pressure at home typically 120   PMH:   has a past medical history of Anxiety, Hyperlipidemia, Hypertension, and Screening (06/04/2008).  PSH:    Past Surgical History:  Procedure Laterality Date   BACK SURGERY  1998   x2    Current Outpatient Medications  Medication Sig Dispense Refill   Multiple Vitamin (MULTI-VITAMIN) tablet Take by mouth.     rosuvastatin (CRESTOR) 5 MG tablet Take 1 tablet (5 mg total) by mouth daily. 90 tablet 3   telmisartan (MICARDIS) 40  MG tablet TAKE 1 TABLET BY MOUTH DAILY 90 tablet 1   vitamin C (ASCORBIC ACID) 500 MG tablet Take 500 mg by mouth daily.     Coenzyme Q10 10 MG capsule Take by mouth.     ezetimibe (ZETIA) 10 MG tablet Take 1 tablet (10 mg total) by mouth daily. (Patient not taking: Reported on 10/13/2021) 90 tablet 2   fluorouracil (EFUDEX) 5 % cream Apply topically 2 (two) times daily. Apply for 1 week to scalp, forehead, temples and ears (Patient not taking: Reported on 10/13/2021) 15 g 0   meloxicam (MOBIC) 15 MG tablet Take 15 mg by mouth daily.     No current facility-administered medications for this visit.     Allergies:   Other and Declomycin [demeclocycline]   Social History:  The patient  reports that he quit smoking about 43 years ago. His smoking use included cigarettes. He has never used smokeless tobacco. He reports that he does not drink alcohol and does not use drugs.   Family History:   family history includes Cancer in his father; Coronary artery disease in his mother; Heart attack in his mother; Heart disease in his mother; Kidney disease in his mother; Multiple myeloma in his father.    Review of Systems: Review of Systems  Constitutional: Negative.   HENT: Negative.    Respiratory: Negative.    Cardiovascular: Negative.   Gastrointestinal: Negative.   Musculoskeletal: Negative.   Neurological: Negative.   Psychiatric/Behavioral:   Negative.    All other systems reviewed and are negative.  PHYSICAL EXAM: VS:  BP (!) 172/90 (BP Location: Right Arm, Patient Position: Sitting, Cuff Size: Normal)   Pulse 98   Ht 6' (1.829 m)   Wt 188 lb (85.3 kg)   SpO2 97%   BMI 25.50 kg/m  , BMI Body mass index is 25.5 kg/m. Constitutional:  oriented to person, place, and time. No distress.  HENT:  Head: Grossly normal Eyes:  no discharge. No scleral icterus.  Neck: No JVD, no carotid bruits  Cardiovascular: Regular rate and rhythm, no murmurs appreciated Pulmonary/Chest: Clear to  auscultation bilaterally, no wheezes or rails Abdominal: Soft.  no distension.  no tenderness.  Musculoskeletal: Normal range of motion Neurological:  normal muscle tone. Coordination normal. No atrophy Skin: Skin warm and dry Psychiatric: normal affect, pleasant  Recent Labs: 10/13/2021: ALT 25; BUN 18; Creatinine, Ser 0.65; Hemoglobin 14.9; Platelets 264.0; Potassium 4.6; Sodium 138    Lipid Panel Lab Results  Component Value Date   CHOL 174 10/13/2021   HDL 40.50 10/13/2021   LDLCALC 116 (H) 10/16/2020   TRIG 224.0 (H) 10/13/2021      Wt Readings from Last 3 Encounters:  11/04/21 188 lb (85.3 kg)  11/02/21 180 lb (81.6 kg)  10/13/21 180 lb (81.6 kg)       ASSESSMENT AND PLAN:  Dilated ascending aorta Previously 4.2 cm On CT scan, images pulled up and reviewed Reports blood pressure stable at home, normally today from rushing in and anxiety being in the office Repeat calcium score ordered to evaluate size of aorta  Palpitations - Plan: EKG 12-Lead Denies having significant PVCs No changes to medications  Hyperlipidemia  tolerating Crestor 5 mg daily, Goal LDL less than 70 We have a coronary calcium score ordered, if numbers considerably higher would consider increasing Crestor dosing to achieve goal  Family history of coronary artery disease Mother was a long-term smoker Had cardiac disease  HYPERTENSION, BENIGN Anxious today, blood pressure elevated, typically well controlled at home using 2 blood pressure cuffs for verification No changes to his medications  Anxiety Anxious on today's visit, as on last clinic visit We will continue to monitor blood pressure at home   Total encounter time more than 60 minutes  Greater than 50% was spent in counseling and coordination of care with the patient    Orders Placed This Encounter  Procedures   EKG 12-Lead     Signed, Tim Gollan, M.D., Ph.D. 11/04/2021  [facility],  [place] [phone]  

## 2021-11-04 NOTE — Patient Instructions (Addendum)
CT coronary calcium score: dilated aorta, coronary calcium  Continue to monitor pressures  Medication Instructions:  No changes  If you need a refill on your cardiac medications before your next appointment, please call your pharmacy.   Lab work:   Testing/Procedures:  We will order CT coronary calcium score  $99 at our [facility] in [place]  Please call Judeth Cornfield at [phone] to schedule   [facility] [address] [place], [place] [address]    Follow-Up: At [facility], you and your health needs are our priority.  As part of our continuing mission to provide you with exceptional heart care, we have created designated Provider Care Teams.  These Care Teams include your primary Cardiologist (physician) and Advanced Practice Providers (APPs -  Physician Assistants and Nurse Practitioners) who all work together to provide you with the care you need, when you need it.  You will need a follow up appointment in 12 months  Providers on your designated Care Team:   Nicolasa Ducking, NP Eula Listen, PA-C Cadence Fransico Michael, [place]  COVID-19 Vaccine Information can be found at: [email or web address] For questions related to vaccine distribution or appointments, please email [email or web address] or call [phone].

## 2021-11-09 ENCOUNTER — Ambulatory Visit
Admission: RE | Admit: 2021-11-09 | Discharge: 2021-11-09 | Disposition: A | Discharge status: Home / Self Care | Payer: Medicare Other | Source: Ambulatory Visit | Attending: Cardiovascular Disease | Admitting: Cardiovascular Disease

## 2021-11-09 DIAGNOSIS — I700 Atherosclerosis of aorta: Secondary | ICD-10-CM | POA: Insufficient documentation

## 2021-11-22 ENCOUNTER — Ambulatory Visit: Payer: Medicare Other | Admitting: Cardiovascular Disease

## 2021-12-28 ENCOUNTER — Other Ambulatory Visit: Payer: Self-pay | Admitting: Family Medicine

## 2021-12-28 DIAGNOSIS — E785 Hyperlipidemia, unspecified: Secondary | ICD-10-CM

## 2021-12-30 ENCOUNTER — Other Ambulatory Visit: Payer: Self-pay

## 2021-12-30 ENCOUNTER — Ambulatory Visit (INDEPENDENT_AMBULATORY_CARE_PROVIDER_SITE_OTHER): Payer: Medicare Other | Admitting: Dermatology

## 2021-12-30 DIAGNOSIS — L570 Actinic keratosis: Secondary | ICD-10-CM | POA: Diagnosis not present

## 2021-12-30 DIAGNOSIS — S80861A Insect bite (nonvenomous), right lower leg, initial encounter: Secondary | ICD-10-CM

## 2021-12-30 DIAGNOSIS — W57XXXA Bitten or stung by nonvenomous insect and other nonvenomous arthropods, initial encounter: Secondary | ICD-10-CM

## 2021-12-30 DIAGNOSIS — S80862A Insect bite (nonvenomous), left lower leg, initial encounter: Secondary | ICD-10-CM | POA: Diagnosis not present

## 2021-12-30 DIAGNOSIS — L814 Other melanin hyperpigmentation: Secondary | ICD-10-CM

## 2021-12-30 DIAGNOSIS — Z1283 Encounter for screening for malignant neoplasm of skin: Secondary | ICD-10-CM

## 2021-12-30 DIAGNOSIS — L578 Other skin changes due to chronic exposure to nonionizing radiation: Secondary | ICD-10-CM | POA: Diagnosis not present

## 2021-12-30 MED ORDER — FLUOROURACIL 5 % EX CREA: TOPICAL_CREAM | Freq: Two times a day (BID) | CUTANEOUS | 4 refills | Status: DC

## 2021-12-30 MED ORDER — TRIAMCINOLONE ACETONIDE 0.147 MG/GM EX AERS: INHALATION_SPRAY | CUTANEOUS | 2 refills | Status: DC

## 2021-12-30 MED ORDER — KENALOG 0.147 MG/GM EX AERS: INHALATION_SPRAY | CUTANEOUS | 2 refills | Status: DC

## 2021-12-30 NOTE — Patient Instructions (Addendum)
Recommend  over the counter Cerave Cream cream or lotion , doves men care line cleansers and moistuizers  Gentle Skin Care Guide  1. Bathe no more than once a day.  2. Avoid bathing in hot water  3. Use a mild soap like Dove, Vanicream, Cetaphil, CeraVe. Can use Lever 2000 or Cetaphil antibacterial soap  4. Use soap only where you need it. On most days, use it under your arms, between your legs, and on your feet. Let the water rinse other areas unless visibly dirty.  5. When you get out of the bath/shower, use a towel to gently blot your skin dry, don't rub it.  6. While your skin is still a little damp, apply a moisturizing cream such as Vanicream, CeraVe, Cetaphil, Eucerin, Sarna lotion or plain Vaseline Jelly. For hands apply Neutrogena [place] Hand Cream or Excipial Hand Cream.  7. Reapply moisturizer any time you start to itch or feel dry.  8. Sometimes using free and clear laundry detergents can be helpful. Fabric softener sheets should be avoided. Downy Free & Gentle liquid, or any liquid fabric softener that is free of dyes and perfumes, it acceptable to use  9. If your doctor has given you prescription creams you may apply moisturizers over them   Restart treatment in about a month   Start 5-fluorouracil/calcipotriene cream twice a day for 7 days to affected areas including top part scalp ear, nose, side burn area. Prescription sent to [facility]. Patient provided with contact information for pharmacy and advised the pharmacy will mail the prescription to their home. Patient provided with handout reviewing treatment course and side effects and advised to call or message [place] on MyChart with any concerns.   5-Fluorouracil/Calcipotriene Patient Education   Actinic keratoses are the dry, red scaly spots on the skin caused by sun damage. A portion of these spots can turn into skin cancer with time, and treating them can help prevent development of skin cancer.   Treatment  of these spots requires removal of the defective skin cells. There are various ways to remove actinic keratoses, including freezing with liquid nitrogen, treatment with creams, or treatment with a blue light procedure in the office.   5-fluorouracil cream is a topical cream used to treat actinic keratoses. It works by interfering with the growth of abnormal fast-growing skin cells, such as actinic keratoses. These cells peel off and are replaced by healthy ones.   5-fluorouracil/calcipotriene is a combination of the 5-fluorouracil cream with a vitamin D analog cream called calcipotriene. The calcipotriene alone does not treat actinic keratoses. However, when it is combined with 5-fluorouracil, it helps the 5-fluorouracil treat the actinic keratoses much faster so that the same results can be achieved with a much shorter treatment time.  INSTRUCTIONS FOR 5-FLUOROURACIL/CALCIPOTRIENE CREAM:   5-fluorouracil/calcipotriene cream typically only needs to be used for 4-7 days. A thin layer should be applied twice a day to the treatment areas recommended by your physician.   If your physician prescribed you separate tubes of 5-fluourouracil and calcipotriene, apply a thin layer of 5-fluorouracil followed by a thin layer of calcipotriene.   Avoid contact with your eyes, nostrils, and mouth. Do not use 5-fluorouracil/calcipotriene cream on infected or open wounds.   You will develop redness, irritation and some crusting at areas where you have pre-cancer damage/actinic keratoses. IF YOU DEVELOP PAIN, BLEEDING, OR SIGNIFICANT CRUSTING, STOP THE TREATMENT EARLY - you have already gotten a good response and the actinic keratoses should clear up well.  Wash your hands after applying 5-fluorouracil 5% cream on your skin.   A moisturizer or sunscreen with a minimum SPF 30 should be applied each morning.   Once you have finished the treatment, you can apply a thin layer of Vaseline twice a day to irritated areas  to soothe and calm the areas more quickly. If you experience significant discomfort, contact your physician.  For some patients it is necessary to repeat the treatment for best results.  SIDE EFFECTS: When using 5-fluorouracil/calcipotriene cream, you may have mild irritation, such as redness, dryness, swelling, or a mild burning sensation. This usually resolves within 2 weeks. The more actinic keratoses you have, the more redness and inflammation you can expect during treatment. Eye irritation has been reported rarely. If this occurs, please let us know.  If you have any trouble using this cream, please call the office. If you have any other questions about this information, please do not hesitate to ask me before you leave the office.   Actinic keratoses are precancerous spots that appear secondary to cumulative UV radiation exposure/sun exposure over time. They are chronic with expected duration over 1 year. A portion of actinic keratoses will progress to squamous cell carcinoma of the skin. It is not possible to reliably predict which spots will progress to skin cancer and so treatment is recommended to prevent development of skin cancer.  Recommend daily broad spectrum sunscreen SPF 30+ to sun-exposed areas, reapply every 2 hours as needed.  Recommend staying in the shade or wearing long sleeves, sun glasses (UVA+UVB protection) and wide brim hats (4-inch brim around the entire circumference of the hat). Call for new or changing lesions.   Cryotherapy Aftercare  Wash gently with soap and water everyday.   Apply Vaseline and Band-Aid daily until healed.     Melanoma ABCDEs  Melanoma is the most dangerous type of skin cancer, and is the leading cause of death from skin disease.  You are more likely to develop melanoma if you: Have light-colored skin, light-colored eyes, or red or blond hair Spend a lot of time in the sun Tan regularly, either outdoors or in a tanning bed Have had  blistering sunburns, especially during childhood Have a close family member who has had a melanoma Have atypical moles or large birthmarks  Early detection of melanoma is key since treatment is typically straightforward and cure rates are extremely high if we catch it early.   The first sign of melanoma is often a change in a mole or a new dark spot.  The ABCDE system is a way of remembering the signs of melanoma.  A for asymmetry:  The two halves do not match. B for border:  The edges of the growth are irregular. C for color:  A mixture of colors are present instead of an even brown color. D for diameter:  Melanomas are usually (but not always) greater than 17mm - the size of a pencil eraser. E for evolution:  The spot keeps changing in size, shape, and color.  Please check your skin once per month between visits. You can use a small mirror in front and a large mirror behind you to keep an eye on the back side or your body.   If you see any new or changing lesions before your next follow-up, please call to schedule a visit.  Please continue daily skin protection including broad spectrum sunscreen SPF 30+ to sun-exposed areas, reapplying every 2 hours as needed when you're outdoors.  Staying in the shade or wearing long sleeves, sun glasses (UVA+UVB protection) and wide brim hats (4-inch brim around the entire circumference of the hat) are also recommended for sun protection.      Due to recent changes in healthcare laws, you may see results of your pathology and/or laboratory studies on MyChart before the doctors have had a chance to review them. We understand that in some cases there may be results that are confusing or concerning to you. Please understand that not all results are received at the same time and often the doctors may need to interpret multiple results in order to provide you with the best plan of care or course of treatment. Therefore, we ask that you please give [place] 2  business days to thoroughly review all your results before contacting the office for clarification. Should we see a critical lab result, you will be contacted sooner.   If You Need Anything After Your Visit  If you have any questions or concerns for your doctor, please call our main line at [phone] and press option 4 to reach your doctor's medical assistant. If no one answers, please leave a voicemail as directed and we will return your call as soon as possible. Messages left after 4 pm will be answered the following business day.   You may also send [place] a message via MyChart. We typically respond to MyChart messages within 1-2 business days.  For prescription refills, please ask your pharmacy to contact our office. Our fax number is [phone].  If you have an urgent issue when the clinic is closed that cannot wait until the next business day, you can page your doctor at the number below.    Please note that while we do our best to be available for urgent issues outside of office hours, we are not available 24/7.   If you have an urgent issue and are unable to reach [place], you may choose to seek medical care at your doctor's office, retail clinic, urgent care center, or emergency room.  If you have a medical emergency, please immediately call 911 or go to the emergency department.  Pager Numbers  - Dr. Gwen Pounds: [phone]  - Dr. Neale Burly: [phone]  - Dr. Roseanne Reno: [phone]  In the event of inclement weather, please call our main line at [phone] for an update on the status of any delays or closures.  Dermatology Medication Tips: Please keep the boxes that topical medications come in in order to help keep track of the instructions about where and how to use these. Pharmacies typically print the medication instructions only on the boxes and not directly on the medication tubes.   If your medication is too expensive, please contact our office at [phone] option 4 or  send [place] a message through MyChart.   We are unable to tell what your co-pay for medications will be in advance as this is different depending on your insurance coverage. However, we may be able to find a substitute medication at lower cost or fill out paperwork to get insurance to cover a needed medication.   If a prior authorization is required to get your medication covered by your insurance company, please allow [place] 1-2 business days to complete this process.  Drug prices often vary depending on where the prescription is filled and some pharmacies may offer cheaper prices.  The website www.goodrx.com contains coupons for medications through different pharmacies. The prices here do not account for what the cost may be with  help from insurance (it may be cheaper with your insurance), but the website can give you the price if you did not use any insurance.  - You can print the associated coupon and take it with your prescription to the pharmacy.  - You may also stop by our office during regular business hours and pick up a GoodRx coupon card.  - If you need your prescription sent electronically to a different pharmacy, notify our office through [facility] or by phone at [phone] option 4.     Si Usted Necesita Algo Despus de Su Visita  Tambin puede enviarnos un mensaje a travs de Clinical cytogeneticist. Por lo general respondemos a los mensajes de MyChart en el transcurso de 1 a 2 das hbiles.  Para renovar recetas, por favor pida a su farmacia que se ponga en contacto con nuestra oficina. Annie Sable de fax es [place] [phone].  Si tiene un asunto urgente cuando la clnica est cerrada y que no puede esperar hasta el siguiente da hbil, puede llamar/localizar a su doctor(a) al nmero que aparece a continuacin.   Por favor, tenga en cuenta que aunque hacemos todo lo posible para estar disponibles para asuntos urgentes fuera del horario de [place], no estamos disponibles las 24 horas del  da, los 7 [address]  [address] de la [place].   Si tiene un problema urgente y no puede comunicarse con nosotros, puede optar por buscar atencin mdica  en el consultorio de su doctor(a), en una clnica privada, en un centro de atencin urgente o en una sala de emergencias.  Si tiene Engineer, drilling, por favor llame inmediatamente al 911 o vaya a la sala de emergencias.  Nmeros de bper  - Dr. Gwen Pounds: [phone]  - Dra. Moye: [phone]  - Dra. Roseanne Reno: [phone]  En caso de inclemencias del [place], por favor llame a Lacy Duverney principal al [phone] para una actualizacin sobre el [place] de cualquier retraso o cierre.  Consejos para la medicacin en dermatologa: Por favor, guarde las cajas en las que vienen los medicamentos de uso tpico para ayudarle a seguir las instrucciones sobre dnde y cmo usarlos. Las farmacias generalmente imprimen las instrucciones del medicamento slo en las cajas y no directamente en los tubos del [place].   Si su medicamento es muy caro, por favor, pngase en contacto con Rolm Gala llamando al [phone] y presione la opcin 4 o envenos un mensaje a travs de Clinical cytogeneticist.   No podemos decirle cul ser su copago por los medicamentos por adelantado ya que esto es diferente dependiendo de la cobertura de su seguro. Sin embargo, es posible que podamos encontrar un medicamento sustituto a Audiological scientist un formulario para que el seguro cubra el medicamento que se considera necesario.   Si se requiere una autorizacin previa para que su compaa de seguros [place] su medicamento, por favor permtanos de 1 a 2 das hbiles para completar [address]  Los precios de los medicamentos varan con frecuencia dependiendo del Environmental consultant de dnde se surte la receta y alguna farmacias pueden ofrecer precios ms baratos.  El sitio web www.goodrx.com tiene cupones para medicamentos de Health and safety inspector. Los precios aqu no tienen en cuenta lo que podra  costar con la ayuda del seguro (puede ser ms barato con su seguro), pero el sitio web puede darle el precio si no utiliz Tourist information centre manager.  - Puede imprimir el cupn correspondiente y llevarlo con su receta a la farmacia.  - Tambin puede pasar por nuestra oficina durante el  horario de Freight forwarder regular y Charity fundraiser una tarjeta de cupones de GoodRx.  - Si necesita que su receta se enve electrnicamente a una farmacia diferente, informe a nuestra oficina a travs de MyChart de [place] o por telfono llamando al [phone] y presione la opcin 4.

## 2021-12-30 NOTE — Progress Notes (Signed)
Pharmacy called regarding patients Kenalog Spray marked DAW. They are unable to order this as brand name. Pharmacist has asked generic rx to be sent in. aw

## 2021-12-30 NOTE — Progress Notes (Signed)
Follow-Up Visit   Subjective  Danny Navarro is a 66 y.o. male who presents for the following: Actinic Keratosis (4 month ak follow, used 29f/u cream at scalp and got good response, ). The patient presents for Upper Body Skin Exam (UBSE) for skin cancer screening and mole check.  The patient has spots, moles and lesions to be evaluated, some may be new or changing and the patient has concerns that these could be cancer.  The following portions of the chart were reviewed this encounter and updated as appropriate:  Tobacco  Allergies  Meds  Problems  Med Hx  Surg Hx  Fam Hx     Review of Systems: No other skin or systemic complaints except as noted in HPI or Assessment and Plan.  Objective  Well appearing patient in no apparent distress; mood and affect are within normal limits.  All skin waist up examined.  Left Ear Erythematous thin papules/macules with gritty scale.    Assessment & Plan  Bug bite, initial encounter Related Medications KENALOG 0.147 MG/GM topical spray APPLY TO THE AFFECTED AREA ON LEGS EVERY DAY TO TWICE DAILY AS NEEDED flares, AVOID FACE, groin,axilla  Actinic keratosis Left Ear In 4 weeks restart Start in 4 weeks  Restart  5-fluorouracil/calcipotriene cream twice a day for 7 days to affected areas including top part scalp ear, nose, side burn area. Prescription sent to [facility]. Patient provided with contact information for pharmacy and advised the pharmacy will mail the prescription to their home. Patient provided with handout reviewing treatment course and side effects and advised to call or message [place] on MyChart with any concerns.  Actinic keratoses are precancerous spots that appear secondary to cumulative UV radiation exposure/sun exposure over time. They are chronic with expected duration over 1 year. A portion of actinic keratoses will progress to squamous cell carcinoma of the skin. It is not possible to reliably predict which spots will  progress to skin cancer and so treatment is recommended to prevent development of skin cancer.  Recommend daily broad spectrum sunscreen SPF 30+ to sun-exposed areas, reapply every 2 hours as needed.  Recommend staying in the shade or wearing long sleeves, sun glasses (UVA+UVB protection) and wide brim hats (4-inch brim around the entire circumference of the hat). Call for new or changing lesions.  Destruction of lesion - Left Ear Complexity: simple   Destruction method: cryotherapy   Informed consent: discussed and consent obtained   Timeout:  patient name, date of birth, surgical site, and procedure verified Lesion destroyed using liquid nitrogen: Yes   Region frozen until ice ball extended beyond lesion: Yes   Outcome: patient tolerated procedure well with no complications   Post-procedure details: wound care instructions given   Additional details:  Prior to procedure, discussed risks of blister formation, small wound, skin dyspigmentation, or rare scar following cryotherapy. Recommend Vaseline ointment to treated areas while healing.   fluorouracil (EFUDEX) 5 % cream - Left Ear Apply topically 2 (two) times daily. Use at scalp, ears, nose, and side burn area for 7 days  Actinic skin damage  Skin cancer screening  Lentigines - Scattered tan macules - Due to sun exposure - Benign-appearing, observe - Recommend daily broad spectrum sunscreen SPF 30+ to sun-exposed areas, reapply every 2 hours as needed. - Call for any changes  Seborrheic Keratoses - Stuck-on, waxy, tan-brown papules and/or plaques  - Benign-appearing - Discussed benign etiology and prognosis. - Observe - Call for any changes  Melanocytic Nevi -  Tan-brown and/or pink-flesh-colored symmetric macules and papules - Benign appearing on exam today - Observation - Call clinic for new or changing moles - Recommend daily use of broad spectrum spf 30+ sunscreen to sun-exposed areas.   Hemangiomas - Red  papules - Discussed benign nature - Observe - Call for any changes  Skin cancer screening performed today.  Actinic Damage - Severe, confluent actinic changes with pre-cancerous actinic keratoses  - Severe, chronic, not at goal, secondary to cumulative UV radiation exposure over time - diffuse scaly erythematous macules and papules with underlying dyspigmentation - Discussed Prescription "Field Treatment" for Severe, Chronic Confluent Actinic Changes with Pre-Cancerous Actinic Keratoses Field treatment involves treatment of an entire area of skin that has confluent Actinic Changes (Sun/ Ultraviolet light damage) and PreCancerous Actinic Keratoses by method of PhotoDynamic Therapy (PDT) and/or prescription Topical Chemotherapy agents such as 5-fluorouracil, 5-fluorouracil/calcipotriene, and/or imiquimod.  The purpose is to decrease the number of clinically evident and subclinical PreCancerous lesions to prevent progression to development of skin cancer by chemically destroying early precancer changes that may or may not be visible.  It has been shown to reduce the risk of developing skin cancer in the treated area. As a result of treatment, redness, scaling, crusting, and open sores may occur during treatment course. One or more than one of these methods may be used and may have to be used several times to control, suppress and eliminate the PreCancerous changes. Discussed treatment course, expected reaction, and possible side effects. - Recommend daily broad spectrum sunscreen SPF 30+ to sun-exposed areas, reapply every 2 hours as needed.  - Staying in the shade or wearing long sleeves, sun glasses (UVA+UVB protection) and wide brim hats (4-inch brim around the entire circumference of the hat) are also recommended. - Call for new or changing lesions.  Start in 4 weeks  Start 5-fluorouracil/calcipotriene cream twice a day for 7 days to affected areas including top part scalp ear, nose, side burn  area. Prescription sent to [facility]. Patient provided with contact information for pharmacy and advised the pharmacy will mail the prescription to their home. Patient provided with handout reviewing treatment course and side effects and advised to call or message [place] on MyChart with any concerns.  Return in about 8 months (around 08/31/2022) for ak followup. IAsher Muir, CMA, am acting as scribe for Armida Sans, MD. Documentation: I have reviewed the above documentation for accuracy and completeness, and I agree with the above.  Armida Sans, MD

## 2022-01-06 ENCOUNTER — Other Ambulatory Visit: Payer: Self-pay

## 2022-01-06 DIAGNOSIS — W57XXXA Bitten or stung by nonvenomous insect and other nonvenomous arthropods, initial encounter: Secondary | ICD-10-CM

## 2022-01-06 MED ORDER — TRIAMCINOLONE ACETONIDE 0.147 MG/GM EX AERS: INHALATION_SPRAY | CUTANEOUS | 2 refills | Status: DC

## 2022-01-06 NOTE — Progress Notes (Signed)
RX transferred for cheaper pricing with [employer]. aw

## 2022-01-08 ENCOUNTER — Encounter: Payer: Self-pay | Admitting: Dermatology

## 2022-02-03 IMAGING — US US EXTREM LOW*R* LIMITED
1 series · 12 of 12 positions shown · non-contrast
Comparison: None.

CLINICAL DATA: Palpable abnormality seen in right thigh.

EXAM:
ULTRASOUND right LOWER EXTREMITY LIMITED
TECHNIQUE: Ultrasound examination of the lower extremity soft tissues was
performed in the area of clinical concern.

[Series 1: us extrem low*right* limited · 0.07mm/px · 12 acquisitions, 12 frames shown]
[im 1/12]
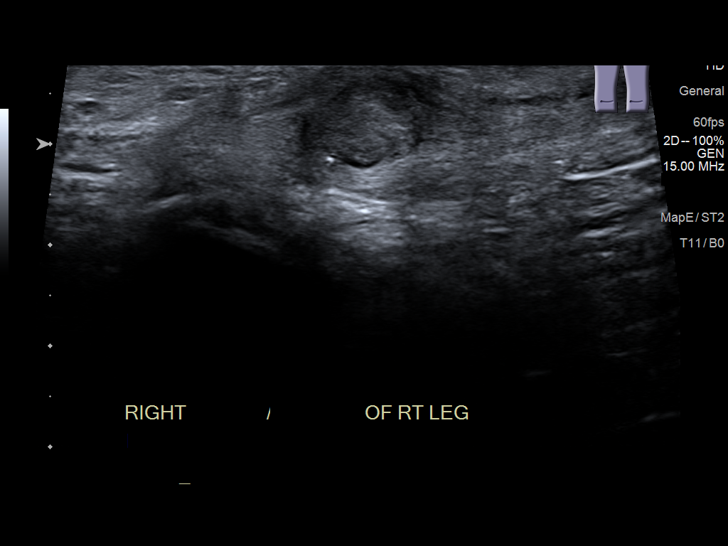
[im 2/12]
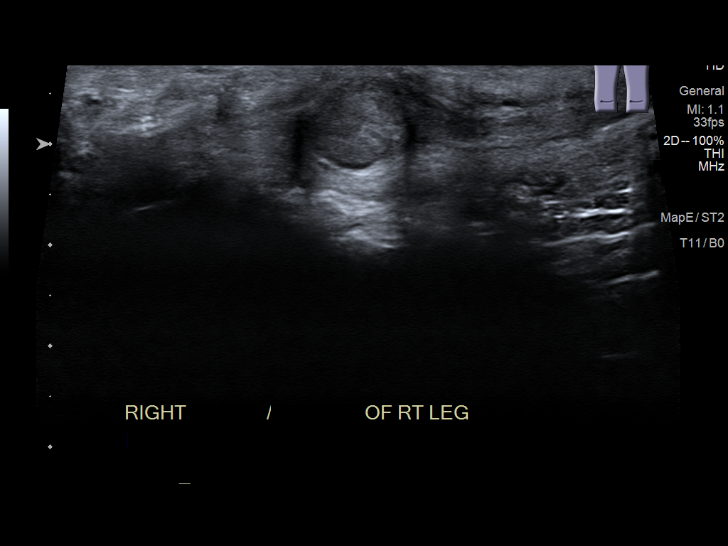
[im 3/12]
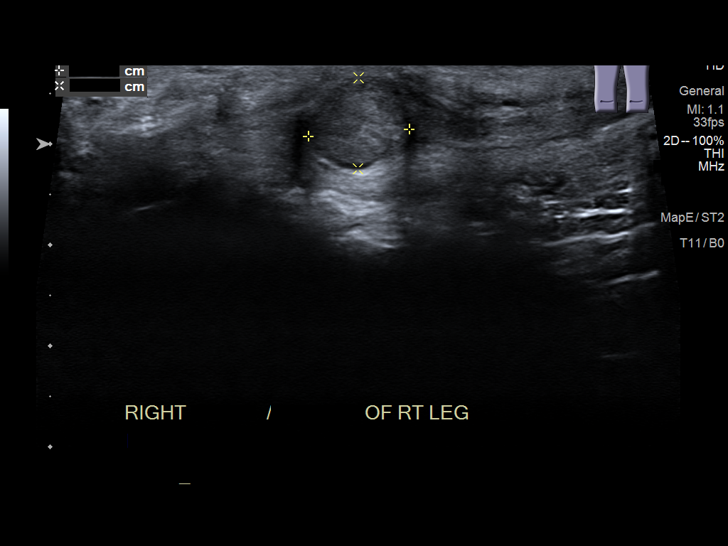
[im 4/12]
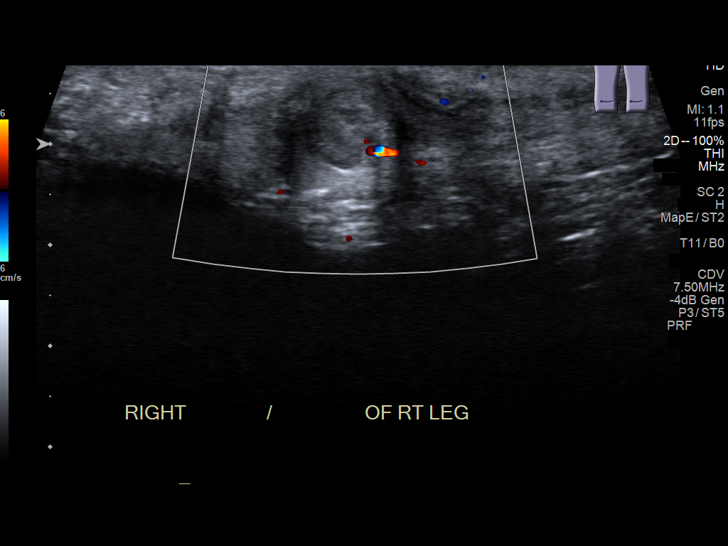
[im 5/12]
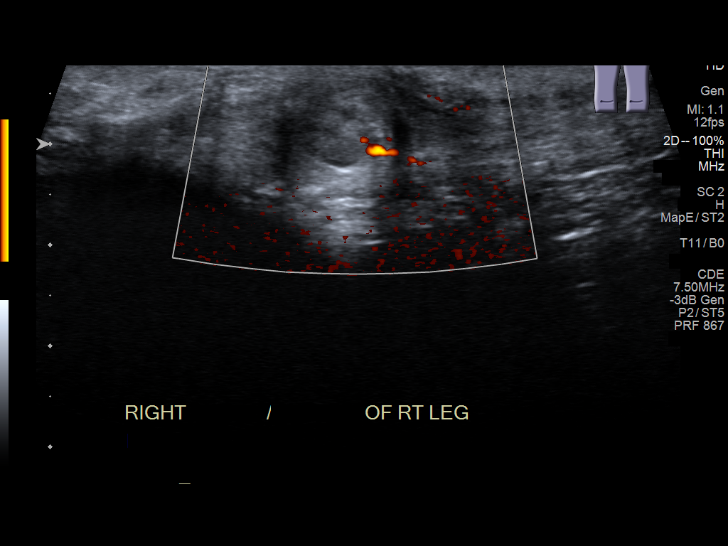
[im 6/12]
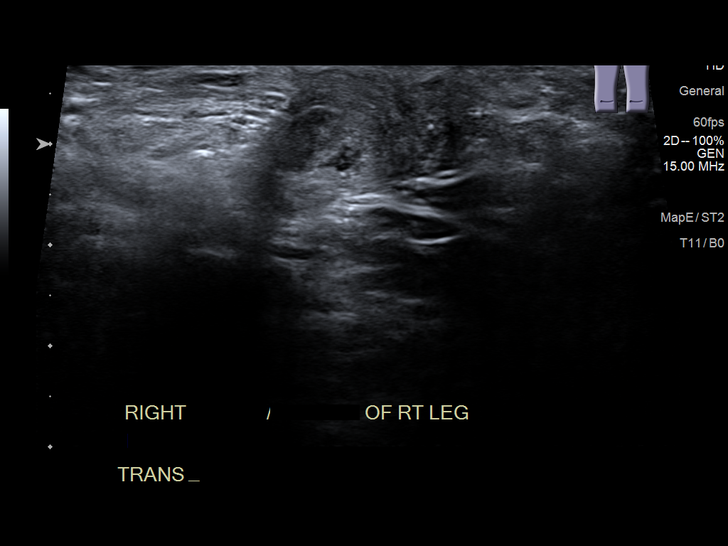
[im 7/12]
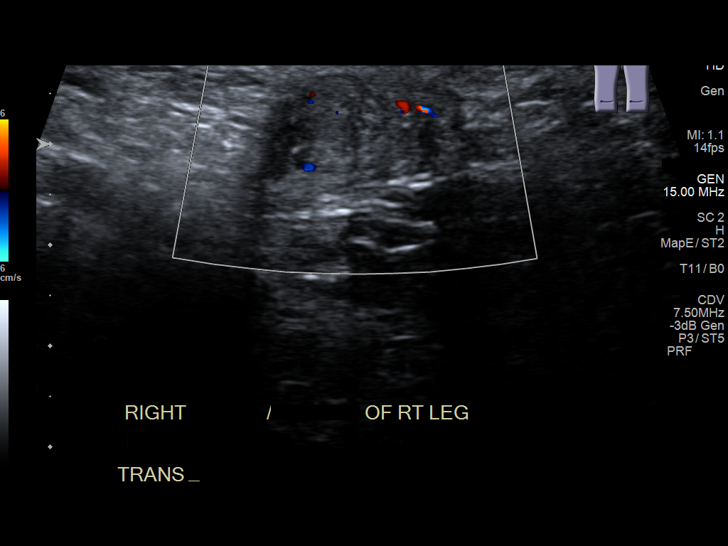
[im 8/12]
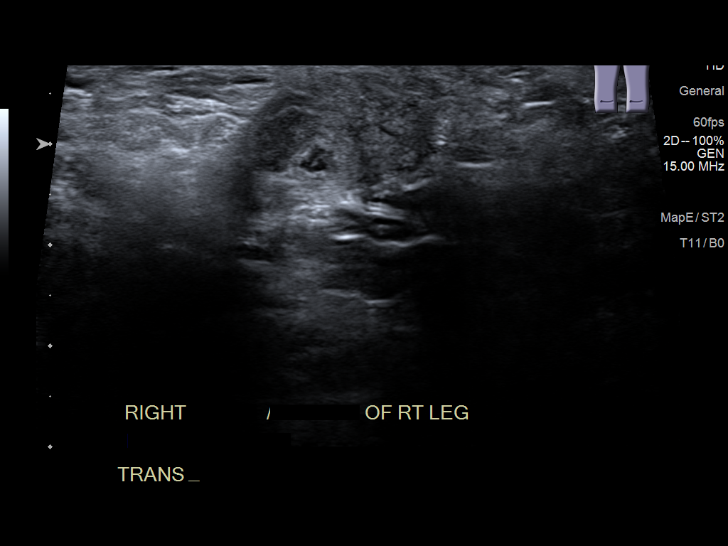
[im 9/12]
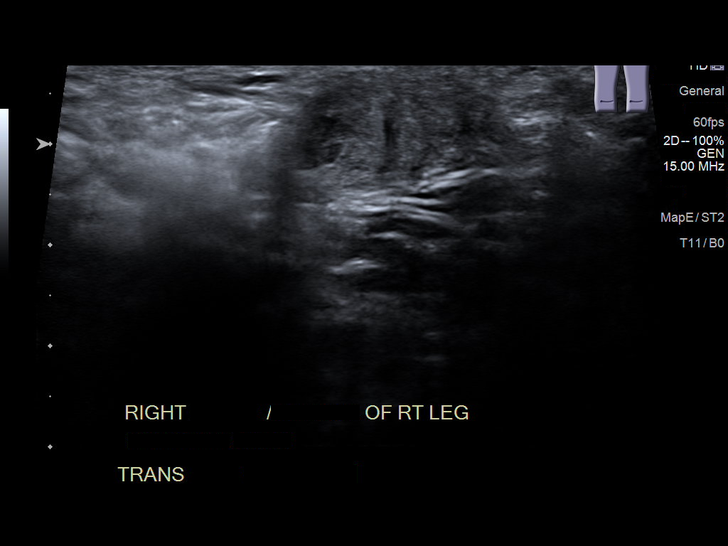
[im 10/12]
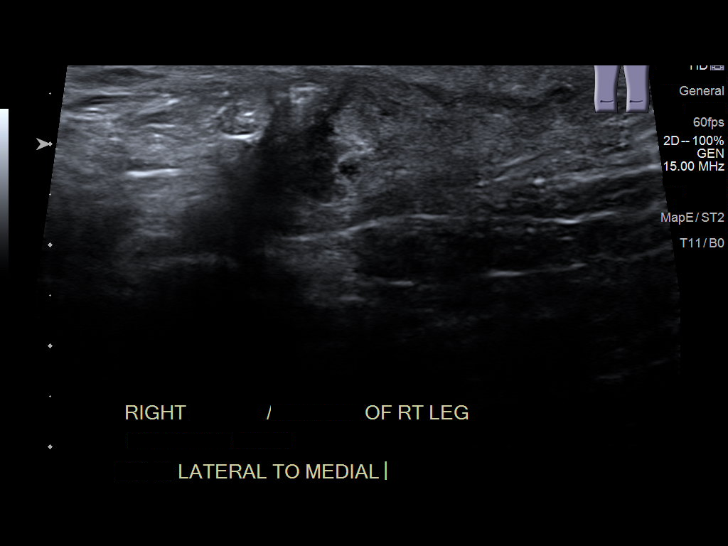
[im 11/12]
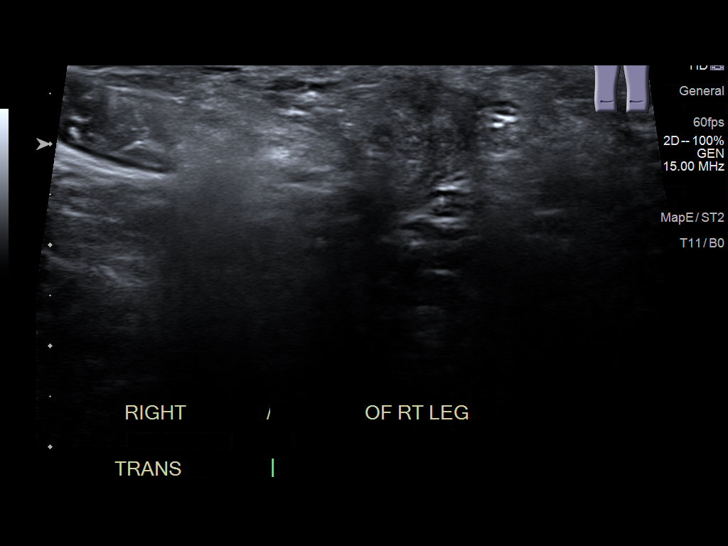
[im 12/12]
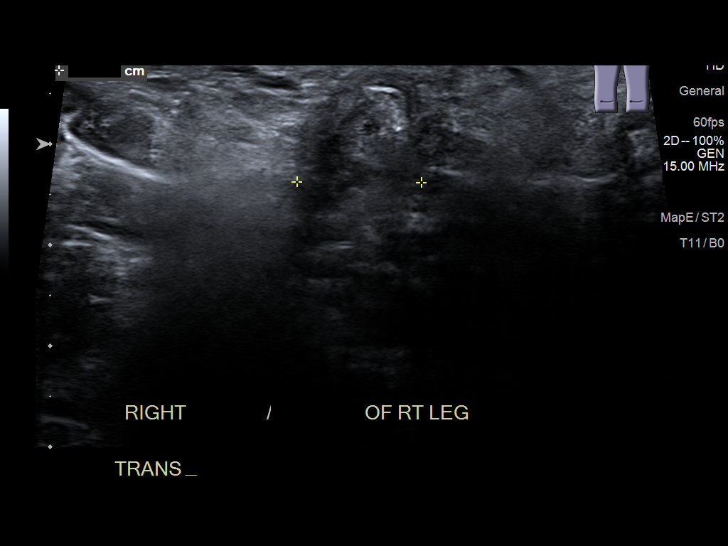

[12 of 12 positions shown; findings below may reference images not displayed]

FINDINGS: Limited sonographic evaluation was performed in the area of palpable
concern in the inner portion of the right thigh proximally. There
appears to be a small to moderate size right inguinal hernia
present. No definite mass or fluid collection is noted.
IMPRESSION: Possible small to moderate size right inguinal hernia is noted. CT
scan is recommended for further evaluation.

## 2022-04-14 ENCOUNTER — Encounter: Payer: Self-pay | Admitting: Internal Medicine

## 2022-04-15 ENCOUNTER — Encounter: Payer: Self-pay | Admitting: Internal Medicine

## 2022-04-15 ENCOUNTER — Ambulatory Visit (INDEPENDENT_AMBULATORY_CARE_PROVIDER_SITE_OTHER): Payer: Medicare Other | Admitting: Internal Medicine

## 2022-04-15 ENCOUNTER — Ambulatory Visit: Payer: Medicare Other | Admitting: Family Medicine

## 2022-04-15 VITALS — BP 113/76 | HR 92 | Temp 97.7°F | Ht 72.0 in | Wt 188.4 lb

## 2022-04-15 DIAGNOSIS — R5383 Other fatigue: Secondary | ICD-10-CM | POA: Diagnosis not present

## 2022-04-15 DIAGNOSIS — Z125 Encounter for screening for malignant neoplasm of prostate: Secondary | ICD-10-CM

## 2022-04-15 DIAGNOSIS — E782 Mixed hyperlipidemia: Secondary | ICD-10-CM

## 2022-04-15 DIAGNOSIS — I10 Essential (primary) hypertension: Secondary | ICD-10-CM

## 2022-04-15 DIAGNOSIS — R7301 Impaired fasting glucose: Secondary | ICD-10-CM

## 2022-04-15 DIAGNOSIS — Z0000 Encounter for general adult medical examination without abnormal findings: Secondary | ICD-10-CM

## 2022-04-15 DIAGNOSIS — I7121 Aneurysm of the ascending aorta, without rupture: Secondary | ICD-10-CM

## 2022-04-15 LAB — COMPREHENSIVE METABOLIC PANEL
ALT: 26 U/L (ref 0–53)
AST: 18 U/L (ref 0–37)
Albumin: 4.5 g/dL (ref 3.5–5.2)
Alkaline Phosphatase: 69 U/L (ref 39–117)
BUN: 16 mg/dL (ref 6–23)
CO2: 29 mEq/L (ref 19–32)
Calcium: 9.4 mg/dL (ref 8.4–10.5)
Chloride: 103 mEq/L (ref 96–112)
Creatinine, Ser: 0.63 mg/dL (ref 0.40–1.50)
GFR: 98.63 mL/min (ref >60.00)
Glucose, Bld: 101 mg/dL — ABNORMAL HIGH (ref 70–99)
Potassium: 4.5 mEq/L (ref 3.5–5.1)
Sodium: 138 mEq/L (ref 135–145)
Total Bilirubin: 0.5 mg/dL (ref 0.2–1.2)
Total Protein: 6.4 g/dL (ref 6.0–8.3)

## 2022-04-15 LAB — CBC WITH DIFFERENTIAL/PLATELET
Basophils Absolute: 0 10*3/uL (ref 0.0–0.1)
Basophils Relative: 1.2 % (ref 0.0–3.0)
Eosinophils Absolute: 0.1 10*3/uL (ref 0.0–0.7)
Eosinophils Relative: 2.1 % (ref 0.0–5.0)
HCT: 42.7 % (ref 39.0–52.0)
Hemoglobin: 14.4 g/dL (ref 13.0–17.0)
Lymphocytes Relative: 36.1 % (ref 12.0–46.0)
Lymphs Abs: 1.5 10*3/uL (ref 0.7–4.0)
MCHC: 33.7 g/dL (ref 30.0–36.0)
MCV: 91.7 fl (ref 78.0–100.0)
Monocytes Absolute: 0.4 10*3/uL (ref 0.1–1.0)
Monocytes Relative: 10.3 % (ref 3.0–12.0)
Neutro Abs: 2.1 10*3/uL (ref 1.4–7.7)
Neutrophils Relative %: 50.3 % (ref 43.0–77.0)
Platelets: 263 10*3/uL (ref 150.0–400.0)
RBC: 4.65 Mil/uL (ref 4.22–5.81)
RDW: 13.5 % (ref 11.5–15.5)
WBC: 4.1 10*3/uL (ref 4.0–10.5)

## 2022-04-15 LAB — LIPID PANEL
Cholesterol: 171 mg/dL (ref 0–200)
HDL: 42.9 mg/dL (ref >39.00)
LDL Cholesterol: 97 mg/dL (ref 0–99)
NonHDL: 127.82
Total CHOL/HDL Ratio: 4
Triglycerides: 156 mg/dL — ABNORMAL HIGH (ref 0.0–149.0)
VLDL: 31.2 mg/dL (ref 0.0–40.0)

## 2022-04-15 LAB — TSH: TSH: 0.89 u[IU]/mL (ref 0.35–5.50)

## 2022-04-15 LAB — LDL CHOLESTEROL, DIRECT: Direct LDL: 96 mg/dL
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
## 2022-04-15 NOTE — Assessment & Plan Note (Signed)
Improved calcium score on repeat CT done Mya 2023  Score now 190 (down from 230) .  Continue current dose of crestor

## 2022-04-15 NOTE — Assessment & Plan Note (Addendum)
UNCHANGED DIAMETER by repeat Coronary calcium CT ordered by [facility] and done in May 2023.  He has been maintaining BP readings at home of 120/70 or less on telmisartan

## 2022-04-17 ENCOUNTER — Encounter: Payer: Self-pay | Admitting: Internal Medicine

## 2022-04-17 NOTE — Assessment & Plan Note (Addendum)
Patient is taking telmisartan  as prescribed and notes no adverse effects.  Home BP readings have been done about once per week and are  generally < 120/70 .  he is avoiding added salt in her diet and working  Out at least  3 times per wek without chest pain , claudication or shortness of breath .  No change today unless future urine test notes proteinuria  Lab Results  Component Value Date   LABMICR See below: 12/04/2020   LABMICR See below: 05/29/2020   MICROALBUR <0.7 10/13/2021   Lab Results  Component Value Date   CHOL 171 04/15/2022   HDL 42.90 04/15/2022   LDLCALC 97 04/15/2022   LDLDIRECT 96.0 04/15/2022   TRIG 156.0 (H) 04/15/2022   CHOLHDL 4 04/15/2022       .

## 2022-04-17 NOTE — Assessment & Plan Note (Signed)

## 2022-05-10 ENCOUNTER — Other Ambulatory Visit: Payer: Self-pay | Admitting: Internal Medicine

## 2022-09-08 ENCOUNTER — Ambulatory Visit: Payer: Medicare Other | Admitting: Dermatology

## 2022-09-22 ENCOUNTER — Other Ambulatory Visit: Payer: Self-pay | Admitting: Internal Medicine

## 2022-09-22 DIAGNOSIS — E785 Hyperlipidemia, unspecified: Secondary | ICD-10-CM

## 2022-10-05 DIAGNOSIS — M25511 Pain in right shoulder: Secondary | ICD-10-CM | POA: Insufficient documentation

## 2022-10-05 DIAGNOSIS — M19019 Primary osteoarthritis, unspecified shoulder: Secondary | ICD-10-CM | POA: Insufficient documentation

## 2022-10-05 DIAGNOSIS — S46911A Strain of unspecified muscle, fascia and tendon at shoulder and upper arm level, right arm, initial encounter: Secondary | ICD-10-CM | POA: Insufficient documentation

## 2022-10-17 ENCOUNTER — Encounter: Payer: Self-pay | Admitting: Internal Medicine

## 2022-10-17 ENCOUNTER — Ambulatory Visit (INDEPENDENT_AMBULATORY_CARE_PROVIDER_SITE_OTHER): Payer: Medicare Other | Admitting: Internal Medicine

## 2022-10-17 VITALS — BP 136/80 | HR 102 | Temp 97.9°F | Ht 72.0 in | Wt 185.8 lb

## 2022-10-17 DIAGNOSIS — I700 Atherosclerosis of aorta: Secondary | ICD-10-CM | POA: Insufficient documentation

## 2022-10-17 DIAGNOSIS — S4991XS Unspecified injury of right shoulder and upper arm, sequela: Secondary | ICD-10-CM | POA: Diagnosis not present

## 2022-10-17 DIAGNOSIS — I7121 Aneurysm of the ascending aorta, without rupture: Secondary | ICD-10-CM | POA: Diagnosis not present

## 2022-10-17 DIAGNOSIS — I10 Essential (primary) hypertension: Secondary | ICD-10-CM | POA: Diagnosis not present

## 2022-10-17 DIAGNOSIS — E782 Mixed hyperlipidemia: Secondary | ICD-10-CM

## 2022-10-17 LAB — LDL CHOLESTEROL, DIRECT: Direct LDL: 89 mg/dL

## 2022-10-17 LAB — COMPREHENSIVE METABOLIC PANEL
ALT: 25 U/L (ref 0–53)
AST: 16 U/L (ref 0–37)
Albumin: 4.6 g/dL (ref 3.5–5.2)
Alkaline Phosphatase: 66 U/L (ref 39–117)
BUN: 15 mg/dL (ref 6–23)
CO2: 26 mEq/L (ref 19–32)
Calcium: 9.5 mg/dL (ref 8.4–10.5)
Chloride: 103 mEq/L (ref 96–112)
Creatinine, Ser: 0.66 mg/dL (ref 0.40–1.50)
GFR: 96.91 mL/min (ref >60.00)
Glucose, Bld: 105 mg/dL — ABNORMAL HIGH (ref 70–99)
Potassium: 4.3 mEq/L (ref 3.5–5.1)
Sodium: 138 mEq/L (ref 135–145)
Total Bilirubin: 0.4 mg/dL (ref 0.2–1.2)
Total Protein: 6.8 g/dL (ref 6.0–8.3)

## 2022-10-17 LAB — LIPID PANEL
Cholesterol: 160 mg/dL (ref 0–200)
HDL: 48.7 mg/dL (ref >39.00)
LDL Cholesterol: 84 mg/dL (ref 0–99)
NonHDL: 111.31
Total CHOL/HDL Ratio: 3
Triglycerides: 136 mg/dL (ref 0.0–149.0)
VLDL: 27.2 mg/dL (ref 0.0–40.0)

## 2022-10-17 LAB — MICROALBUMIN / CREATININE URINE RATIO
Creatinine,U: 96.8 mg/dL
Microalb Creat Ratio: 0.7 mg/g (ref 0.0–30.0)
Microalb, Ur: <0.7 mg/dL (ref 0.0–1.9)

## 2022-10-17 NOTE — Patient Instructions (Addendum)
Shoulder guys:  Francena Hanly, Dorthula Nettles and  ?  Poggi ([employer])   Keep BP 120/70

## 2022-10-17 NOTE — Assessment & Plan Note (Addendum)
Unchanged fro 2019 to 2023 by repeat Coronary calcium CT.  Continue to keep BP  120/70. He declines annual follow up imaging

## 2022-10-17 NOTE — Assessment & Plan Note (Addendum)
Patient is taking telmisartan  as prescribed and notes no adverse effects.  Home BP readings have been done about once per week and are  generally < 120/70 .  he is avoiding added salt in his diet and working  Out at least  3 times per wek without chest pain , claudication or shortness of breath .  No change today unless future urine test notes proteinuria  Lab Results  Component Value Date   LABMICR See below: 12/04/2020   LABMICR See below: 05/29/2020   MICROALBUR <0.7 10/13/2021   Lab Results  Component Value Date   CHOL 171 04/15/2022   HDL 42.90 04/15/2022   LDLCALC 97 04/15/2022   LDLDIRECT 96.0 04/15/2022   TRIG 156.0 (H) 04/15/2022   CHOLHDL 4 04/15/2022       .

## 2022-10-17 NOTE — Assessment & Plan Note (Signed)
Probable rotator cuff injury, MRI results pending  using ibuprofen and tylenol.  Await results,  discussed options of PT vs surgery

## 2022-10-17 NOTE — Assessment & Plan Note (Signed)
Aortic atherosclerosis:   Patient is tolerating high potency statin therapy  for risk reduction and  stabilization of atherosclerotic placque noted on prior abdominal CT which was reviewed during today's visit  .  LDL is < 100  Lab Results  Component Value Date   CHOL 160 10/17/2022   HDL 48.70 10/17/2022   LDLCALC 84 10/17/2022   LDLDIRECT 89.0 10/17/2022   TRIG 136.0 10/17/2022   CHOLHDL 3 10/17/2022
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
## 2022-10-20 ENCOUNTER — Ambulatory Visit (INDEPENDENT_AMBULATORY_CARE_PROVIDER_SITE_OTHER): Payer: Medicare Other | Admitting: Dermatology

## 2022-10-20 VITALS — BP 138/89 | HR 78

## 2022-10-20 DIAGNOSIS — Z7189 Other specified counseling: Secondary | ICD-10-CM

## 2022-10-20 DIAGNOSIS — X32XXXA Exposure to sunlight, initial encounter: Secondary | ICD-10-CM

## 2022-10-20 DIAGNOSIS — W908XXA Exposure to other nonionizing radiation, initial encounter: Secondary | ICD-10-CM

## 2022-10-20 DIAGNOSIS — L570 Actinic keratosis: Secondary | ICD-10-CM | POA: Diagnosis not present

## 2022-10-20 DIAGNOSIS — L578 Other skin changes due to chronic exposure to nonionizing radiation: Secondary | ICD-10-CM | POA: Diagnosis not present

## 2022-10-20 DIAGNOSIS — L820 Inflamed seborrheic keratosis: Secondary | ICD-10-CM | POA: Diagnosis not present

## 2022-10-20 DIAGNOSIS — Z5111 Encounter for antineoplastic chemotherapy: Secondary | ICD-10-CM

## 2022-10-20 DIAGNOSIS — Z79899 Other long term (current) drug therapy: Secondary | ICD-10-CM

## 2022-10-20 MED ORDER — FLUOROURACIL 5 % EX CREA: TOPICAL_CREAM | CUTANEOUS | 2 refills | Status: DC

## 2022-10-20 NOTE — Patient Instructions (Addendum)
Instructions for [facility] Medications  One or more of your medications was sent to the [facility] mail order compounding pharmacy. You will receive an email from them and can purchase the medicine through that link. It will then be mailed to your home at the address you confirmed. If for any reason you do not receive an email from them, please check your spam folder. If you still do not find the email, please let us know. [facility] phone number is [phone].      5-Fluorouracil/Calcipotriene Patient Education  In 1 month begin 5FU/Calcipotriene cream  Start on the ears and hands apply twice a day for 7- 10 days    Actinic keratoses are the dry, red scaly spots on the skin caused by sun damage. A portion of these spots can turn into skin cancer with time, and treating them can help prevent development of skin cancer.   Treatment of these spots requires removal of the defective skin cells. There are various ways to remove actinic keratoses, including freezing with liquid nitrogen, treatment with creams, or treatment with a blue light procedure in the office.   5-fluorouracil cream is a topical cream used to treat actinic keratoses. It works by interfering with the growth of abnormal fast-growing skin cells, such as actinic keratoses. These cells peel off and are replaced by healthy ones.   5-fluorouracil/calcipotriene is a combination of the 5-fluorouracil cream with a vitamin D analog cream called calcipotriene. The calcipotriene alone does not treat actinic keratoses. However, when it is combined with 5-fluorouracil, it helps the 5-fluorouracil treat the actinic keratoses much faster so that the same results can be achieved with a much shorter treatment time.  INSTRUCTIONS FOR 5-FLUOROURACIL/CALCIPOTRIENE CREAM:   5-fluorouracil/calcipotriene cream typically only needs to be used for 4-7 days. A thin layer should be applied twice a day to the treatment areas recommended by  your physician.   If your physician prescribed you separate tubes of 5-fluourouracil and calcipotriene, apply a thin layer of 5-fluorouracil followed by a thin layer of calcipotriene.   Avoid contact with your eyes, nostrils, and mouth. Do not use 5-fluorouracil/calcipotriene cream on infected or open wounds.   You will develop redness, irritation and some crusting at areas where you have pre-cancer damage/actinic keratoses. IF YOU DEVELOP PAIN, BLEEDING, OR SIGNIFICANT CRUSTING, STOP THE TREATMENT EARLY - you have already gotten a good response and the actinic keratoses should clear up well.  Wash your hands after applying 5-fluorouracil 5% cream on your skin.   A moisturizer or sunscreen with a minimum SPF 30 should be applied each morning.   Once you have finished the treatment, you can apply a thin layer of Vaseline twice a day to irritated areas to soothe and calm the areas more quickly. If you experience significant discomfort, contact your physician.  For some patients it is necessary to repeat the treatment for best results.  SIDE EFFECTS: When using 5-fluorouracil/calcipotriene cream, you may have mild irritation, such as redness, dryness, swelling, or a mild burning sensation. This usually resolves within 2 weeks. The more actinic keratoses you have, the more redness and inflammation you can expect during treatment. Eye irritation has been reported rarely. If this occurs, please let us know.  If you have any trouble using this cream, please call the office. If you have any other questions about this information, please do not hesitate to ask me before you leave the office.      Cryotherapy Aftercare  Wash gently with  soap and water everyday.   Apply Vaseline and Band-Aid daily until healed.      Due to recent changes in healthcare laws, you may see results of your pathology and/or laboratory studies on MyChart before the doctors have had a chance to review them. We understand  that in some cases there may be results that are confusing or concerning to you. Please understand that not all results are received at the same time and often the doctors may need to interpret multiple results in order to provide you with the best plan of care or course of treatment. Therefore, we ask that you please give [place] 2 business days to thoroughly review all your results before contacting the office for clarification. Should we see a critical lab result, you will be contacted sooner.   If You Need Anything After Your Visit  If you have any questions or concerns for your doctor, please call our main line at [phone] and press option 4 to reach your doctor's medical assistant. If no one answers, please leave a voicemail as directed and we will return your call as soon as possible. Messages left after 4 pm will be answered the following business day.   You may also send [place] a message via MyChart. We typically respond to MyChart messages within 1-2 business days.  For prescription refills, please ask your pharmacy to contact our office. Our fax number is [phone].  If you have an urgent issue when the clinic is closed that cannot wait until the next business day, you can page your doctor at the number below.    Please note that while we do our best to be available for urgent issues outside of office hours, we are not available 24/7.   If you have an urgent issue and are unable to reach [place], you may choose to seek medical care at your doctor's office, retail clinic, urgent care center, or emergency room.  If you have a medical emergency, please immediately call 911 or go to the emergency department.  Pager Numbers  - Dr. Gwen Pounds: [phone]  - Dr. Neale Burly: [phone]  - Dr. Roseanne Reno: [phone]  In the event of inclement weather, please call our main line at [phone] for an update on the status of any delays or closures.  Dermatology Medication Tips: Please keep the  boxes that topical medications come in in order to help keep track of the instructions about where and how to use these. Pharmacies typically print the medication instructions only on the boxes and not directly on the medication tubes.   If your medication is too expensive, please contact our office at [phone] option 4 or send [place] a message through MyChart.   We are unable to tell what your co-pay for medications will be in advance as this is different depending on your insurance coverage. However, we may be able to find a substitute medication at lower cost or fill out paperwork to get insurance to cover a needed medication.   If a prior authorization is required to get your medication covered by your insurance company, please allow [place] 1-2 business days to complete this process.  Drug prices often vary depending on where the prescription is filled and some pharmacies may offer cheaper prices.  The website www.goodrx.com contains coupons for medications through different pharmacies. The prices here do not account for what the cost may be with help from insurance (it may be cheaper with your insurance), but the website can give you the price  if you did not use any insurance.  - You can print the associated coupon and take it with your prescription to the pharmacy.  - You may also stop by our office during regular business hours and pick up a GoodRx coupon card.  - If you need your prescription sent electronically to a different pharmacy, notify our office through [facility] or by phone at [phone] option 4.     Si Usted Necesita Algo Despus de Su Visita  Tambin puede enviarnos un mensaje a travs de Clinical cytogeneticist. Por lo general respondemos a los mensajes de MyChart en el transcurso de 1 a 2 das hbiles.  Para renovar recetas, por favor pida a su farmacia que se ponga en contacto con nuestra oficina. Annie Sable de fax es [place] [phone].  Si tiene un asunto urgente cuando la  clnica est cerrada y que no puede esperar hasta el siguiente da hbil, puede llamar/localizar a su doctor(a) al nmero que aparece a continuacin.   Por favor, tenga en cuenta que aunque hacemos todo lo posible para estar disponibles para asuntos urgentes fuera del horario de [place], no estamos disponibles las 24 horas del da, los 7 [address]  [address] de la [place].   Si tiene un problema urgente y no puede comunicarse con nosotros, puede optar por buscar atencin mdica  en el consultorio de su doctor(a), en una clnica privada, en un centro de atencin urgente o en una sala de emergencias.  Si tiene Engineer, drilling, por favor llame inmediatamente al 911 o vaya a la sala de emergencias.  Nmeros de bper  - Dr. Gwen Pounds: [phone]  - Dra. Moye: [phone]  - Dra. Roseanne Reno: [phone]  En caso de inclemencias del [place], por favor llame a Lacy Duverney principal al [phone] para una actualizacin sobre el [place] de cualquier retraso o cierre.  Consejos para la medicacin en dermatologa: Por favor, guarde las cajas en las que vienen los medicamentos de uso tpico para ayudarle a seguir las instrucciones sobre dnde y cmo usarlos. Las farmacias generalmente imprimen las instrucciones del medicamento slo en las cajas y no directamente en los tubos del [place].   Si su medicamento es muy caro, por favor, pngase en contacto con Rolm Gala llamando al [phone] y presione la opcin 4 o envenos un mensaje a travs de Clinical cytogeneticist.   No podemos decirle cul ser su copago por los medicamentos por adelantado ya que esto es diferente dependiendo de la cobertura de su seguro. Sin embargo, es posible que podamos encontrar un medicamento sustituto a Audiological scientist un formulario para que el seguro cubra el medicamento que se considera necesario.   Si se requiere una autorizacin previa para que su compaa de seguros [place] su medicamento, por favor permtanos de 1 a 2 das hbiles  para completar [address]  Los precios de los medicamentos varan con frecuencia dependiendo del Environmental consultant de dnde se surte la receta y alguna farmacias pueden ofrecer precios ms baratos.  El sitio web www.goodrx.com tiene cupones para medicamentos de Health and safety inspector. Los precios aqu no tienen en cuenta lo que podra costar con la ayuda del seguro (puede ser ms barato con su seguro), pero el sitio web puede darle el precio si no utiliz Tourist information centre manager.  - Puede imprimir el cupn correspondiente y llevarlo con su receta a la farmacia.  - Tambin puede pasar por nuestra oficina durante el horario de atencin regular y Education officer, museum una tarjeta de cupones de GoodRx.  - Si necesita que su  su receta se enve electrnicamente a una farmacia diferente, informe a nuestra oficina a travs de MyChart de [place] o por telfono llamando al [phone] y presione la opcin 4.  

## 2022-10-20 NOTE — Progress Notes (Signed)
Follow-Up Visit   Subjective  Danny Navarro is a 67 y.o. male who presents for the following: f/u on precancers on his face, scalp and ears treated his scalp with 5FU/Calcipotriene cream last year with a fair response.  The patient has spots, moles and lesions to be evaluated, some may be new or changing and the patient may have concern these could be cancer.  The following portions of the chart were reviewed this encounter and updated as appropriate: medications, allergies, medical history  Review of Systems:  No other skin or systemic complaints except as noted in HPI or Assessment and Plan.  Objective  Well appearing patient in no apparent distress; mood and affect are within normal limits. A focused examination was performed of the following areas: Relevant exam findings are noted in the Assessment and Plan.  right ear x 1, right side burn x 1, left ear x 2, hands x 13 (17) Erythematous thin papules/macules with gritty scale.   right neck x 2, left forearm x 1 (3) Stuck-on, waxy, tan-brown papule or plaque --Discussed benign etiology and prognosis.    Assessment & Plan   AK (actinic keratosis) (17) right ear x 1, right side burn x 1, left ear x 2, hands x 13  Actinic keratoses are precancerous spots that appear secondary to cumulative UV radiation exposure/sun exposure over time. They are chronic with expected duration over 1 year. A portion of actinic keratoses will progress to squamous cell carcinoma of the skin. It is not possible to reliably predict which spots will progress to skin cancer and so treatment is recommended to prevent development of skin cancer.  Recommend daily broad spectrum sunscreen SPF 30+ to sun-exposed areas, reapply every 2 hours as needed.  Recommend staying in the shade or wearing long sleeves, sun glasses (UVA+UVB protection) and wide brim hats (4-inch brim around the entire circumference of the hat). Call for new or changing lesions.    Destruction of lesion - right ear x 1, right side burn x 1, left ear x 2, hands x 13 Complexity: simple   Destruction method: cryotherapy   Informed consent: discussed and consent obtained   Timeout:  patient name, date of birth, surgical site, and procedure verified Lesion destroyed using liquid nitrogen: Yes   Region frozen until ice ball extended beyond lesion: Yes   Outcome: patient tolerated procedure well with no complications   Post-procedure details: wound care instructions given    Inflamed seborrheic keratosis (3) right neck x 2, left forearm x 1  Symptomatic, irritating, patient would like treated.   Destruction of lesion - right neck x 2, left forearm x 1 Complexity: simple   Destruction method: cryotherapy   Informed consent: discussed and consent obtained   Timeout:  patient name, date of birth, surgical site, and procedure verified Lesion destroyed using liquid nitrogen: Yes   Region frozen until ice ball extended beyond lesion: Yes   Outcome: patient tolerated procedure well with no complications   Post-procedure details: wound care instructions given     ACTINIC DAMAGE WITH PRECANCEROUS ACTINIC KERATOSES Counseling for Topical Chemotherapy Management: Patient exhibits: - Severe, confluent actinic changes with pre-cancerous actinic keratoses that is secondary to cumulative UV radiation exposure over time - Condition that is severe; chronic, not at goal. - diffuse scaly erythematous macules and papules with underlying dyspigmentation - Discussed Prescription "Field Treatment" topical Chemotherapy for Severe, Chronic Confluent Actinic Changes with Pre-Cancerous Actinic Keratoses Field treatment involves treatment of an entire area of  skin that has confluent Actinic Changes (Sun/ Ultraviolet light damage) and PreCancerous Actinic Keratoses by method of PhotoDynamic Therapy (PDT) and/or prescription Topical Chemotherapy agents such as 5-fluorouracil,  5-fluorouracil/calcipotriene, and/or imiquimod.  The purpose is to decrease the number of clinically evident and subclinical PreCancerous lesions to prevent progression to development of skin cancer by chemically destroying early precancer changes that may or may not be visible.  It has been shown to reduce the risk of developing skin cancer in the treated area. As a result of treatment, redness, scaling, crusting, and open sores may occur during treatment course. One or more than one of these methods may be used and may have to be used several times to control, suppress and eliminate the PreCancerous changes. Discussed treatment course, expected reaction, and possible side effects. - Recommend daily broad spectrum sunscreen SPF 30+ to sun-exposed areas, reapply every 2 hours as needed.  - Staying in the shade or wearing long sleeves, sun glasses (UVA+UVB protection) and wide brim hats (4-inch brim around the entire circumference of the hat) are also recommended. - Call for new or changing lesions.  In 1 month begin 5FU/Calcipotriene cream  Start on the ears and hands apply twice a day for 7- 10 days   Return in about 1 year (around 10/20/2023) for Aks .  IAngelique Holm, CMA, am acting as scribe for Armida Sans, MD .   Documentation: I have reviewed the above documentation for accuracy and completeness, and I agree with the above.  Armida Sans, MD

## 2022-10-24 ENCOUNTER — Telehealth: Payer: Self-pay | Admitting: Internal Medicine

## 2022-10-24 NOTE — Telephone Encounter (Signed)
Copied from CRM [id number]. Topic: Medicare AWV >> Oct 24, 2022  1:57 PM Payton Doughty wrote: Reason for CRM: Called patient to schedule Medicare Annual Wellness Visit (AWV). Left message for patient to call back and schedule Medicare Annual Wellness Visit (AWV).  Last date of AWV: 11/02/21  Please schedule an appointment at any time with [facility].  If any questions, please contact me.  Thank you ,  Verlee Rossetti; Care Guide Ambulatory Clinical Support  l [facility] Direct Dial: [phone]

## 2022-10-25 ENCOUNTER — Telehealth: Payer: Self-pay | Admitting: Internal Medicine

## 2022-10-25 NOTE — Telephone Encounter (Signed)
Contacted Danny Navarro to schedule their annual wellness visit. Appointment made for 11/08/2022.  Verlee Rossetti; Care Guide Ambulatory Clinical Support [place] [facility] Direct Dial: [phone]

## 2022-10-26 ENCOUNTER — Encounter: Payer: Self-pay | Admitting: Dermatology

## 2022-11-07 NOTE — Progress Notes (Unsigned)
I connected with  Danny Navarro on 11/08/2022 by a audio enabled telemedicine application and verified that I am speaking with the correct person using two identifiers.  Patient Location: Home  Provider Location: Home Office  I discussed the limitations of evaluation and management by telemedicine. The patient expressed understanding and agreed to proceed.   Subjective:   Danny Navarro is a 67 y.o. male who presents for Medicare Annual/Subsequent preventive examination.  Review of Systems    Per HPI unless specifically indicated below.  Cardiac Risk Factors include: advanced age (>96men, >59 women);male gender, Elevated blood pressure reading, and Hyperlipidemia.          Objective:       10/20/2022   10:45 AM 10/20/2022   10:27 AM 10/17/2022    8:04 AM  Vitals with BMI  Height   6\' 0"   Weight   185 lbs 13 oz  BMI   25.19  Systolic 138 149 604  Diastolic 89 96 80  Pulse 78 86 102    There were no vitals filed for this visit. There is no height or weight on file to calculate BMI.     11/08/2022    8:33 AM 11/02/2021   12:43 PM 04/12/2016    7:18 AM  Advanced Directives  Does Patient Have a Medical Advance Directive? Yes Yes Yes  Type of Estate agent of [place];Living will Healthcare Power of [place];Living will Healthcare Power of [place];Living will  Does patient want to make changes to medical advance directive? No - Patient declined No - Patient declined   Copy of Healthcare Power of Attorney in Chart? No - copy requested No - copy requested     Current Medications (verified) Outpatient Encounter Medications as of 11/08/2022  Medication Sig   Coenzyme Q10 10 MG capsule Take by mouth.   fluorouracil (EFUDEX) 5 % cream Apply to hands and ears twice a day for 7-10 days   Multiple Vitamin (MULTI-VITAMIN) tablet Take by mouth.   rosuvastatin (CRESTOR) 5 MG tablet TAKE 1 TABLET BY MOUTH DAILY   telmisartan (MICARDIS) 40 MG tablet TAKE 1  TABLET BY MOUTH DAILY   triamcinolone (KENALOG) 0.147 MG/GM topical spray APPLY TO THE AFFECTED AREA ON LEGS EVERY DAY TO TWICE DAILY AS NEEDED flares, AVOID FACE, groin,axilla   vitamin C (ASCORBIC ACID) 500 MG tablet Take 500 mg by mouth daily.   No facility-administered encounter medications on file as of 11/08/2022.    Allergies (verified) Other and Declomycin [demeclocycline]   History: Past Medical History:  Diagnosis Date   Anxiety    Hyperlipidemia    Hypertension    Screening 06/04/2008   GXT, walked 15:25. 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  Tobacco Use   Smoking status: Former  Types: Cigarettes    Quit date: 06/20/1978    Years since quitting: 44.4   Smokeless tobacco: Never  Vaping Use   Vaping Use: Never used  Substance and Sexual Activity   Alcohol use: No   Drug use: No   Sexual activity: Not on file  Other Topics Concern   Not on file  Social History Narrative   Married with 2 kids   Owns his own business   Gets regular exercise   Social Determinants of Health   Financial Resource Strain: Low Risk  (11/08/2022)   Overall Financial Resource Strain (CARDIA)    Difficulty of Paying Living Expenses: Not hard at all  Food Insecurity: No Food Insecurity (11/08/2022)   Hunger Vital Sign    Worried About Running Out of Food in the Last Year: Never true    Ran Out of Food in the Last Year: Never true  Transportation  Needs: No Transportation Needs (11/08/2022)   PRAPARE - Administrator, Civil Service (Medical): No    Lack of Transportation (Non-Medical): No  Physical Activity: Sufficiently Active (11/08/2022)   Exercise Vital Sign    Days of Exercise per Week: 4 days    Minutes of Exercise per Session: 60 min  Stress: No Stress Concern Present (11/08/2022)   [employer] of Occupational Health - Occupational Stress Questionnaire    Feeling of Stress : Not at all  Social Connections: Socially Integrated (11/08/2022)   Social Connection and Isolation Panel [NHANES]    Frequency of Communication with Friends and Family: More than three times a week    Frequency of Social Gatherings with Friends and Family: More than three times a week    Attends Religious Services: More than 4 times per year    Active Member of [employer] or Organizations: Yes    Attends Engineer, structural: More than 4 times per year    Marital Status: Married    Tobacco Counseling Counseling given: No   Clinical Intake:  Pre-visit preparation completed: No  Pain : No/denies pain     Nutritional Status: BMI of 19-24  Normal Nutritional Risks: None Diabetes: No  How often do you need to have someone help you when you read instructions, pamphlets, or other written materials from your doctor or pharmacy?: 1 - Never  Diabetic? no  Interpreter Needed?: No  Information entered by :: Laurel Dimmer, CMA   Activities of Daily Living    11/08/2022    8:28 AM  In your present state of health, do you have any difficulty performing the following activities:  Hearing? 0  Vision? 0  Difficulty concentrating or making decisions? 0  Walking or climbing stairs? 0  Dressing or bathing? 0  Doing errands, shopping? 0    Patient Care Team: Sherlene Shams, MD as PCP - General (Internal Medicine)  Indicate any recent Medical Services you may have received from other than [facility] providers in the past year (date  may be approximate).     Assessment:   This is a routine wellness examination for Danny Navarro.   Hearing/Vision screen Denies any hearing issues. Denies any change to her vision.Annual Eye Exam. [facility]   Dietary issues and exercise activities discussed: Current Exercise Habits: Structured exercise class, Type of exercise: Other - see comments (Cycling), Time (Minutes): 60, Frequency (Times/Week): 4, Weekly Exercise (Minutes/Week): 240, Intensity: Intense, Exercise limited by: None identified   Goals Addressed   None    Depression Screen  11/08/2022    8:27 AM 10/17/2022    8:04 AM 04/15/2022    8:14 AM 11/02/2021   12:44 PM 10/13/2021    8:02 AM 10/16/2020    8:32 AM 10/15/2019    8:11 AM  PHQ 2/9 Scores  PHQ - 2 Score 0 0 0 0 0 0 0    Fall Risk    11/08/2022    8:28 AM 10/17/2022    8:04 AM 04/15/2022    8:14 AM 11/02/2021   12:43 PM 10/13/2021    8:02 AM  Fall Risk   Falls in the past year? 0 0 0 0 0  Number falls in past yr: 0 0 0 0   Injury with Fall? 0 0 0    Risk for fall due to : No Fall Risks No Fall Risks No Fall Risks  No Fall Risks  Follow up Falls evaluation completed Falls evaluation completed Falls evaluation completed Falls evaluation completed Falls evaluation completed    FALL RISK PREVENTION PERTAINING TO THE HOME:  Any stairs in or around the home? Yes  If so, are there any without handrails? No  Home free of loose throw rugs in walkways, pet beds, electrical cords, etc? Yes  Adequate lighting in your home to reduce risk of falls? Yes   ASSISTIVE DEVICES UTILIZED TO PREVENT FALLS:  Life alert? No  Use of a cane, walker or w/c? No  Grab bars in the bathroom? No  Shower chair or bench in shower? Yes  Elevated toilet seat or a handicapped toilet? No   TIMED UP AND GO:  Was the test performed? Unable to perform, virtual appointment   Cognitive Function:        11/08/2022    8:30 AM  6CIT Screen  What Year? 0 points  What month? 0  points  What time? 0 points  Count back from 20 0 points  Months in reverse 0 points  Repeat phrase 0 points  Total Score 0 points    Immunizations Immunization History  Administered Date(s) Administered   Tdap 06/02/2010, 04/05/2021    TDAP status: Up to date  Flu Vaccine status: Up to date  Pneumococcal vaccine status: Due, Education has been provided regarding the importance of this vaccine. Advised may receive this vaccine at local pharmacy or Health Dept. Aware to provide a copy of the vaccination record if obtained from local pharmacy or Health Dept. Verbalized acceptance and understanding.  Covid-19 vaccine status: Information provided on how to obtain vaccines.   Qualifies for Shingles Vaccine? Yes   Zostavax completed No   Shingrix Completed?: No.    Education has been provided regarding the importance of this vaccine. Patient has been advised to call insurance company to determine out of pocket expense if they have not yet received this vaccine. Advised may also receive vaccine at local pharmacy or Health Dept. Verbalized acceptance and understanding.  Screening Tests Health Maintenance  Topic Date Due   Zoster Vaccines- Shingrix (1 of 2) 01/16/2023 (Originally 01/24/1975)   Pneumonia Vaccine 82+ Years old (1 of 1 - PCV) 10/17/2023 (Originally 01/25/2020)   INFLUENZA VACCINE  01/19/2023   Medicare Annual Wellness (AWV)  11/08/2023   COLONOSCOPY (Pts 45-52yrs Insurance coverage will need to be confirmed)  04/12/2026   DTaP/Tdap/Td (3 - Td or Tdap) 04/06/2031   Hepatitis C Screening  Completed   HPV VACCINES  Aged Out   COVID-19 Vaccine  Discontinued    Health Maintenance  There are no preventive care  reminders to display for this patient.   Colorectal cancer screening: Type of screening: Colonoscopy. Completed 04/05/2016. Repeat every 10 years  Lung Cancer Screening: (Low Dose CT Chest recommended if Age 67-80 years, 30 pack-year currently smoking OR have quit w/in  15years.) does not qualify.   Lung Cancer Screening Referral: not applicable   Additional Screening:  Hepatitis C Screening: does qualify; Completed 12/02/2015  Vision Screening: Recommended annual ophthalmology exams for early detection of glaucoma and other disorders of the eye. Is the patient up to date with their annual eye exam?  Yes  Who is the provider or what is the name of the office in which the patient attends annual eye exams? [facility]  If pt is not established with a provider, would they like to be referred to a provider to establish care? No .   Dental Screening: Recommended annual dental exams for proper oral hygiene  Community Resource Referral / Chronic Care Management: CRR required this visit?  No   CCM required this visit?  No      Plan:     I have personally reviewed and noted the following in the patient's chart:   Medical and social history Use of alcohol, tobacco or illicit drugs  Current medications and supplements including opioid prescriptions. Patient is not currently taking opioid prescriptions. Functional ability and status Nutritional status Physical activity Advanced directives List of other physicians Hospitalizations, surgeries, and ER visits in previous 12 months Vitals Screenings to include cognitive, depression, and falls Referrals and appointments  In addition, I have reviewed and discussed with patient certain preventive protocols, quality metrics, and best practice recommendations. A written personalized care plan for preventive services as well as general preventive health recommendations were provided to patient.     Lonna Cobb, CMA   11/08/2022  Nurse Notes: Approximately 30 minute Non-Face -To-Face Medicare Wellness Visit      I have reviewed the above information and agree with above.   Duncan Dull, MD

## 2022-11-07 NOTE — Patient Instructions (Signed)
Health Maintenance, Male Adopting a healthy lifestyle and getting preventive care are important in promoting health and wellness. Ask your health care provider about: The right schedule for you to have regular tests and exams. Things you can do on your own to prevent diseases and keep yourself healthy. What should I know about diet, weight, and exercise? Eat a healthy diet  Eat a diet that includes plenty of vegetables, fruits, low-fat dairy products, and lean protein. Do not eat a lot of foods that are high in solid fats, added sugars, or sodium. Maintain a healthy weight Body mass index (BMI) is a measurement that can be used to identify possible weight problems. It estimates body fat based on height and weight. Your health care provider can help determine your BMI and help you achieve or maintain a healthy weight. Get regular exercise Get regular exercise. This is one of the most important things you can do for your health. Most adults should: Exercise for at least 150 minutes each week. The exercise should increase your heart rate and make you sweat (moderate-intensity exercise). Do strengthening exercises at least twice a week. This is in addition to the moderate-intensity exercise. Spend less time sitting. Even light physical activity can be beneficial. Watch cholesterol and blood lipids Have your blood tested for lipids and cholesterol at 68 years of age, then have this test every 5 years. You may need to have your cholesterol levels checked more often if: Your lipid or cholesterol levels are high. You are older than 68 years of age. You are at high risk for heart disease. What should I know about cancer screening? Many types of cancers can be detected early and may often be prevented. Depending on your health history and family history, you may need to have cancer screening at various ages. This may include screening for: Colorectal cancer. Prostate cancer. Skin cancer. Lung  cancer. What should I know about heart disease, diabetes, and high blood pressure? Blood pressure and heart disease High blood pressure causes heart disease and increases the risk of stroke. This is more likely to develop in people who have high blood pressure readings or are overweight. Talk with your health care provider about your target blood pressure readings. Have your blood pressure checked: Every 3-5 years if you are 18-39 years of age. Every year if you are 40 years old or older. If you are between the ages of 65 and 75 and are a current or former smoker, ask your health care provider if you should have a one-time screening for abdominal aortic aneurysm (AAA). Diabetes Have regular diabetes screenings. This checks your fasting blood sugar level. Have the screening done: Once every three years after age 45 if you are at a normal weight and have a low risk for diabetes. More often and at a younger age if you are overweight or have a high risk for diabetes. What should I know about preventing infection? Hepatitis B If you have a higher risk for hepatitis B, you should be screened for this virus. Talk with your health care provider to find out if you are at risk for hepatitis B infection. Hepatitis C Blood testing is recommended for: Everyone born from 1945 through 1965. Anyone with known risk factors for hepatitis C. Sexually transmitted infections (STIs) You should be screened each year for STIs, including gonorrhea and chlamydia, if: You are sexually active and are younger than 68 years of age. You are older than 68 years of age and your   health care provider tells you that you are at risk for this type of infection. Your sexual activity has changed since you were last screened, and you are at increased risk for chlamydia or gonorrhea. Ask your health care provider if you are at risk. Ask your health care provider about whether you are at high risk for HIV. Your health care provider  may recommend a prescription medicine to help prevent HIV infection. If you choose to take medicine to prevent HIV, you should first get tested for HIV. You should then be tested every 3 months for as long as you are taking the medicine. Follow these instructions at home: Alcohol use Do not drink alcohol if your health care provider tells you not to drink. If you drink alcohol: Limit how much you have to 0-2 drinks a day. Know how much alcohol is in your drink. In the [place] one drink equals one 12 oz bottle of beer (355 mL), one 5 oz glass of wine (148 mL), or one 1 oz glass of hard liquor (44 mL). Lifestyle Do not use any products that contain nicotine or tobacco. These products include cigarettes, chewing tobacco, and vaping devices, such as e-cigarettes. If you need help quitting, ask your health care provider. Do not use street drugs. Do not share needles. Ask your health care provider for help if you need support or information about quitting drugs. General instructions Schedule regular health, dental, and eye exams. Stay current with your vaccines. Tell your health care provider if: You often feel depressed. You have ever been abused or do not feel safe at home. Summary Adopting a healthy lifestyle and getting preventive care are important in promoting health and wellness. Follow your health care provider's instructions about healthy diet, exercising, and getting tested or screened for diseases. Follow your health care provider's instructions on monitoring your cholesterol and blood pressure. This information is not intended to replace advice given to you by your health care provider. Make sure you discuss any questions you have with your health care provider. Document Revised: 10/26/2020 Document Reviewed: 10/26/2020 Elsevier Patient Education  2023 Elsevier Inc.  

## 2022-11-08 ENCOUNTER — Ambulatory Visit (INDEPENDENT_AMBULATORY_CARE_PROVIDER_SITE_OTHER): Payer: Medicare Other

## 2022-11-08 DIAGNOSIS — Z0000 Encounter for general adult medical examination without abnormal findings: Secondary | ICD-10-CM | POA: Diagnosis not present

## 2022-11-11 ENCOUNTER — Other Ambulatory Visit: Payer: Self-pay | Admitting: Internal Medicine

## 2022-12-14 ENCOUNTER — Other Ambulatory Visit: Payer: Self-pay | Admitting: Internal Medicine

## 2022-12-14 MED ORDER — TRAMADOL HCL 50 MG PO TABS: 50.0000 mg | ORAL_TABLET | Freq: Four times a day (QID) | ORAL | 0 refills | Status: AC | PRN

## 2023-02-07 NOTE — Progress Notes (Deleted)
Cardiology Office Note    Date:  02/07/2023   ID:  Gevon, Spradley 07-07-1955, MRN [id number]  PCP:  Sherlene Shams, MD  Cardiologist:  Julien Nordmann, MD  Electrophysiologist:  None   Chief Complaint: Follow-up  History of Present Illness:   ZIKORA DANKERS is a 67 y.o. male with history of ***  ***   Labs independently reviewed: 09/2022 - potassium 4.3, BUN 15, serum creatinine 0.66, albumin 4.6, AST/ALT normal, direct LDL 89, TC 160, TG 136, HDL 48 03/2022 - TSH normal, Hgb 14.4, PLT 263  Past Medical History:  Diagnosis Date   Anxiety    Hyperlipidemia    Hypertension    Screening 06/04/2008   GXT, walked 15:25. No CO or ECG changes    Past Surgical History:  Procedure Laterality Date   BACK SURGERY  1998   x2    Current Medications: No outpatient medications have been marked as taking for the 02/09/23 encounter (Appointment) with Sondra Barges, PA-C.    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  Tobacco Use   Smoking status: Former    Current packs/day: 0.00    Types: Cigarettes    Quit date: 06/20/1978    Years since quitting: 44.6   Smokeless tobacco: Never  Vaping Use   Vaping status: Never Used  Substance and Sexual Activity   Alcohol use: No   Drug use: No   Sexual activity: Not on file  Other Topics Concern   Not on file  Social History Narrative   Married with 2 kids   Owns his own business   Gets regular exercise   Social Determinants of Health   Financial Resource Strain: Low Risk  (11/08/2022)   Overall Financial Resource Strain (CARDIA)    Difficulty of Paying Living Expenses: Not hard at all  Food Insecurity: No Food Insecurity (11/08/2022)   Hunger  Vital Sign    Worried About Running Out of Food in the Last Year: Never true    Ran Out of Food in the Last Year: Never true  Transportation Needs: No Transportation Needs (11/08/2022)   PRAPARE - Administrator, Civil Service (Medical): No    Lack of Transportation (Non-Medical): No  Physical Activity: Sufficiently Active (11/08/2022)   Exercise Vital Sign    Days of Exercise per Week: 4 days    Minutes of Exercise per Session: 60 min  Stress: No Stress Concern Present (11/08/2022)   [employer] of Occupational Health - Occupational Stress Questionnaire    Feeling of Stress : Not at all  Social Connections: Socially Integrated (11/08/2022)   Social Connection and Isolation Panel [NHANES]    Frequency of Communication with Friends and Family: More than three times a week    Frequency of Social Gatherings with Friends and Family: More than three times a week    Attends Religious Services: More than 4 times per year    Active Member of [employer] or Organizations: Yes    Attends Engineer, structural: More than 4 times per year    Marital Status: Married     Family History:  The  patient's family history includes Cancer in his father; Coronary artery disease in his mother; Heart attack in his mother; Heart disease in his mother; Kidney disease in his mother; Multiple myeloma in his father.  ROS:   12-point review of systems is negative unless otherwise noted in the HPI.   EKGs/Labs/Other Studies Reviewed:    Studies reviewed were summarized above. The additional studies were reviewed today:  Calcium score 11/09/2021: Ascending Aorta: Normal size   Pericardium: Normal   Coronary arteries: Normal origin of left and right coronary arteries. Distribution of arterial calcifications if present, as noted below;   LM 0   LAD 20.1   LCx 12.9   RCA 157   Total 190   IMPRESSION AND RECOMMENDATION: 1. Coronary calcium score of 190. This was 64th percentile for  age and sex matched control. 2.  CAC 100-299 in LAD, LCx, RCA   CAC-DRS A2/N3. 3.  Recommend aspirin and statin if no contraindications. 4.  Continue heart healthy lifestyle and risk factor modification. __________  Calcium score 06/06/2018: Ascending Aorta: Mildly dilated in axial views measuring 42 mm. Mild diffuse calcifications.   Pericardium: Normal.   Coronary arteries: Normal origin.   IMPRESSION: 1. Coronary calcium score of 239. This was 61 percentile for age and sex matched control. 2. Mildly ascending aortic aneurysm measuring 42 mm in axial views. Chest CTA or MRA is recommended for full evaluation.    EKG:  EKG is ordered today.  The EKG ordered today demonstrates ***  Recent Labs: 04/15/2022: Hemoglobin 14.4; Platelets 263.0; TSH 0.89 10/17/2022: ALT 25; BUN 15; Creatinine, Ser 0.66; Potassium 4.3; Sodium 138  Recent Lipid Panel    Component Value Date/Time   CHOL 160 10/17/2022 0847   TRIG 136.0 10/17/2022 0847   HDL 48.70 10/17/2022 0847   CHOLHDL 3 10/17/2022 0847   VLDL 27.2 10/17/2022 0847   LDLCALC 84 10/17/2022 0847   LDLDIRECT 89.0 10/17/2022 0847    PHYSICAL EXAM:    VS:  There were no vitals taken for this visit.  BMI: There is no height or weight on file to calculate BMI.  Physical Exam  Wt Readings from Last 3 Encounters:  10/17/22 185 lb 12.8 oz (84.3 kg)  04/15/22 188 lb 6.4 oz (85.5 kg)  11/04/21 188 lb (85.3 kg)     ASSESSMENT & PLAN:   ***   {Are you ordering a CV Procedure (e.g. stress test, cath, DCCV, TEE, etc)?   Press F2        :[id number]}     Disposition: F/u with Dr. Mariah Milling or an APP in ***.   Medication Adjustments/Labs and Tests Ordered: Current medicines are reviewed at length with the patient today.  Concerns regarding medicines are outlined above. Medication changes, Labs and Tests ordered today are summarized above and listed in the Patient Instructions accessible in Encounters.   Signed, Eula Listen,  PA-C 02/07/2023 2:32 PM     [facility] [place] [address] [place], [place] [address] [phone]

## 2023-02-09 ENCOUNTER — Ambulatory Visit: Payer: Medicare Other | Admitting: Physician Assistant

## 2023-02-14 NOTE — Progress Notes (Signed)
Cardiology Office Note    Date:  02/17/2023   ID:  Kaz, Risher 09/25/1955, MRN [id number]  PCP:  Sherlene Shams, MD  Cardiologist:  Julien Nordmann, MD  Electrophysiologist:  None   Chief Complaint: Follow up  History of Present Illness:   Danny Navarro is a 67 y.o. male with history of coronary artery calcification, aortic atherosclerosis, HLD, HTN, PVCs, and possible ascending thoracic aortic dilatation who presents for follow up of coronary artery calcification.  Calcium score in 2019 of 239 which was the 75th percentile.  The ascending thoracic aorta measured at 42 mm at that time.  Repeat calcium score in 10/2021 with a calcium score of 190 which was the 64th percentile.  Ascending thoracic aorta measured as normal size.  It was last seen in the office in 10/2021 and was without symptoms of angina or cardiac decompensation.  BP was elevated in the office, he reported some anxiety.  Remained very active without cardiac limitation.   He comes in doing well from a cardiac perspective and is without symptoms of angina or cardiac decompensation.  No dizziness, presyncope, or syncope.  Remains very active at baseline, bicycling 80 to 100 miles on the weekend without cardiac limitation.  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  Checks blood pressure at home with readings typically in the 1 teens to 120s systolic.  No lower extremity swelling, abdominal distention, early satiety, or progressive orthopnea.  Overall, feels well and does not have any acute cardiac concerns at this time.   Labs independently reviewed: 09/2022 - potassium 4.3, BUN 15, serum creatinine 0.66, albumin 4.6, AST/ALT normal, direct LDL 89, TC 160, TG 136, HDL 48 03/2022 - TSH normal, Hgb 14.4, PLT 263  Past Medical History:  Diagnosis Date   Anxiety    Hyperlipidemia    Hypertension    Screening 06/04/2008   GXT, walked 15:25. No CO or ECG changes    Past Surgical History:  Procedure Laterality Date   BACK  SURGERY  1998   x2    Current Medications: Current Meds  Medication Sig   Coenzyme Q10 10 MG capsule Take by mouth.   Multiple Vitamin (MULTI-VITAMIN) tablet Take by mouth.   rosuvastatin (CRESTOR) 5 MG tablet TAKE 1 TABLET BY MOUTH DAILY   telmisartan (MICARDIS) 40 MG tablet TAKE 1 TABLET BY MOUTH DAILY   vitamin C (ASCORBIC ACID) 500 MG tablet Take 500 mg by mouth daily.    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  Tobacco Use   Smoking status: Former    Current packs/day: 0.00    Types: Cigarettes    Quit date: 06/20/1978    Years since quitting: 44.6   Smokeless tobacco: Never  Vaping Use   Vaping status: Never Used  Substance and Sexual Activity   Alcohol use: No   Drug use: No   Sexual activity: Not on file  Other Topics Concern   Not on file  Social History Narrative   Married with 2 kids   Owns his own business   Gets regular exercise   Social Determinants of Health   Financial Resource Strain: Low  Risk  (11/08/2022)   Overall Financial Resource Strain (CARDIA)    Difficulty of Paying Living Expenses: Not hard at all  Food Insecurity: No Food Insecurity (11/08/2022)   Hunger Vital Sign    Worried About Running Out of Food in the Last Year: Never true    Ran Out of Food in the Last Year: Never true  Transportation Needs: No Transportation Needs (11/08/2022)   PRAPARE - Administrator, Civil Service (Medical): No    Lack of Transportation (Non-Medical): No  Physical Activity: Sufficiently Active (11/08/2022)   Exercise Vital Sign    Days of Exercise per Week: 4 days    Minutes of Exercise per Session: 60 min  Stress: No Stress Concern Present (11/08/2022)   [employer] of Occupational Health - Occupational Stress Questionnaire    Feeling of Stress : Not at all  Social Connections: Socially Integrated (11/08/2022)   Social Connection and Isolation Panel [NHANES]    Frequency of Communication with Friends and Family: More than three times a week    Frequency of Social Gatherings with Friends and Family: More than three times a week    Attends Religious Services: More than 4 times per year    Active Member of [employer] or Organizations: Yes    Attends Engineer, structural: More than 4 times per year    Marital Status: Married     Family History:  The patient's family history includes Cancer in his father; Coronary artery disease in his mother; Heart attack in his mother; Heart disease in his mother; Kidney disease in his mother; Multiple myeloma in his father.  ROS:   12-point review of systems is negative unless otherwise noted in the HPI.   EKGs/Labs/Other Studies Reviewed:    Studies reviewed were summarized above. The additional studies were reviewed today:  Calcium score 11/09/2021: Ascending Aorta: Normal size   Pericardium: Normal   Coronary arteries: Normal origin of left and right coronary arteries. Distribution of arterial calcifications if present, as noted below;   LM 0   LAD 20.1   LCx 12.9   RCA 157   Total 190   IMPRESSION AND RECOMMENDATION: 1. Coronary calcium score of 190. This was 64th percentile for age and sex matched control. 2.  CAC 100-299 in LAD, LCx, RCA   CAC-DRS A2/N3. 3.  Recommend aspirin and statin if no contraindications. 4.  Continue heart healthy lifestyle and risk factor modification. __________  Calcium score 06/06/2018: Ascending Aorta: Mildly dilated in axial views measuring 42 mm. Mild diffuse calcifications.   Pericardium: Normal.   Coronary arteries: Normal origin.   IMPRESSION: 1. Coronary calcium score of 239. This was 33 percentile for age and sex matched control. 2.  Mildly ascending aortic aneurysm measuring 42 mm in axial views. Chest CTA or MRA is recommended for full evaluation.   EKG:  EKG is ordered today.  The EKG ordered today demonstrates NSR, 80 bpm, no acute ST-T changes  Recent Labs: 04/15/2022: Hemoglobin 14.4; Platelets 263.0; TSH 0.89 10/17/2022: ALT 25; BUN 15; Creatinine, Ser 0.66; Potassium 4.3; Sodium 138  Recent Lipid Panel    Component Value Date/Time   CHOL 160 10/17/2022 0847   TRIG 136.0 10/17/2022 0847   HDL 48.70 10/17/2022 0847   CHOLHDL 3 10/17/2022 0847   VLDL 27.2 10/17/2022 0847   LDLCALC 84 10/17/2022 0847   LDLDIRECT 89.0 10/17/2022 0847    PHYSICAL EXAM:    VS:  BP 136/88 (BP Location: Left Arm,  Patient Position: Sitting, Cuff Size: Normal)   Pulse 80   Ht 6' (1.829 m)   Wt 189 lb 12.8 oz (86.1 kg)   SpO2 97%   BMI 25.74 kg/m   BMI: Body mass index is 25.74 kg/m.  Physical Exam Vitals reviewed.  Constitutional:      Appearance: He is well-developed.  HENT:     Head: Normocephalic and atraumatic.  Eyes:     General:        Right eye: No discharge.        Left eye: No discharge.  Neck:     Vascular: No JVD.  Cardiovascular:     Rate and Rhythm: Normal rate and regular rhythm.     Pulses:          Posterior tibial pulses are 2+ on the right side and 2+ on the left side.     Heart sounds: Normal heart sounds, S1 normal and S2 normal. Heart sounds not distant. No midsystolic click and no opening snap. No murmur heard.    No friction rub.  Pulmonary:     Effort: Pulmonary effort is normal. No respiratory distress.     Breath sounds: Normal breath sounds. No decreased breath sounds, wheezing or rales.  Chest:     Chest wall: No tenderness.  Abdominal:     General: There is no distension.  Musculoskeletal:     Cervical back: Normal range of motion.     Right lower leg: No edema.     Left lower leg: No edema.  Skin:    General: Skin is warm and dry.     Nails: There is no clubbing.   Neurological:     Mental Status: He is alert and oriented to person, place, and time.  Psychiatric:        Speech: Speech normal.        Behavior: Behavior normal.        Thought Content: Thought content normal.        Judgment: Judgment normal.     Wt Readings from Last 3 Encounters:  02/17/23 189 lb 12.8 oz (86.1 kg)  10/17/22 185 lb 12.8 oz (84.3 kg)  04/15/22 188 lb 6.4 oz (85.5 kg)     ASSESSMENT & PLAN:   CAD/aortic atherosclerosis/HLD: He is doing well and without symptoms concerning for angina or cardiac decompensation.  LDL 89 in 09/2022 with normal AST/ALT at that time.  Schedule calcium score.  Remains on rosuvastatin.  Possible ascending thoracic aortic dilatation: Calcium score in 2019 with an ascending thoracic aorta measuring 42 mm.  Repeat calcium score in 2023 with a normal size aorta.  Proceed with calcium score as outlined above for further evaluation of thoracic aorta.  If there is dilatation noted, may need to consider CTA aorta.  HTN: Blood pressure is mildly elevated in the office, though well-controlled at home.  He has a history of whitecoat hypertension.  Continue to monitor blood pressure.  Remains on telmisartan.   Disposition: F/u with Dr. Mariah Milling or an APP in 12 months.   Medication Adjustments/Labs and Tests Ordered: Current medicines are reviewed at length with the patient today.  Concerns regarding medicines are outlined above. Medication changes, Labs and Tests ordered today are summarized above and listed in the Patient Instructions accessible in Encounters.   Signed, Eula Listen, PA-C 02/17/2023 12:39 PM     [facility] [place] [address] [place], [place] [address] [phone]

## 2023-02-17 ENCOUNTER — Ambulatory Visit: Payer: Medicare Other | Attending: Physician Assistant | Admitting: Physician Assistant

## 2023-02-17 ENCOUNTER — Encounter: Payer: Self-pay | Admitting: Physician Assistant

## 2023-02-17 VITALS — BP 136/88 | HR 80 | Ht 72.0 in | Wt 189.8 lb

## 2023-02-17 DIAGNOSIS — I77810 Thoracic aortic ectasia: Secondary | ICD-10-CM

## 2023-02-17 DIAGNOSIS — I700 Atherosclerosis of aorta: Secondary | ICD-10-CM

## 2023-02-17 DIAGNOSIS — I10 Essential (primary) hypertension: Secondary | ICD-10-CM | POA: Diagnosis present

## 2023-02-17 DIAGNOSIS — I2510 Atherosclerotic heart disease of native coronary artery without angina pectoris: Secondary | ICD-10-CM | POA: Diagnosis not present

## 2023-02-17 DIAGNOSIS — E785 Hyperlipidemia, unspecified: Secondary | ICD-10-CM | POA: Diagnosis not present

## 2023-02-17 DIAGNOSIS — I2584 Coronary atherosclerosis due to calcified coronary lesion: Secondary | ICD-10-CM | POA: Diagnosis present

## 2023-02-17 NOTE — Patient Instructions (Signed)
Medication Instructions:  Your Physician recommend you continue on your current medication as directed.    *If you need a refill on your cardiac medications before your next appointment, please call your pharmacy*   Lab Work: None If you have labs (blood work) drawn today and your tests are completely normal, you will receive your results only by: MyChart Message (if you have MyChart) OR A paper copy in the mail If you have any lab test that is abnormal or we need to change your treatment, we will call you to review the results.   Testing/Procedures: CT coronary calcium score.   - $99 out of pocket cost at the time of your test - Call [phone] to schedule at your convenience.  [facility] [address] [place], [place] [address]   Coronary CalciumScan A coronary calcium scan is an imaging test used to look for deposits of calcium and other fatty materials (plaques) in the inner lining of the blood vessels of the heart (coronary arteries). These deposits of calcium and plaques can partly clog and narrow the coronary arteries without producing any symptoms or warning signs. This puts a person at risk for a heart attack. This test can detect these deposits before symptoms develop. Tell a health care provider about: Any allergies you have. All medicines you are taking, including vitamins, herbs, eye drops, creams, and over-the-counter medicines. Any problems you or family members have had with anesthetic medicines. Any blood disorders you have. Any surgeries you have had. Any medical conditions you have. Whether you are pregnant or may be pregnant. What are the risks? Generally, this is a safe procedure. However, problems may occur, including: Harm to a pregnant woman and her unborn baby. This test involves the use of radiation. Radiation exposure can be dangerous to a pregnant woman and her unborn baby. If you are pregnant, you  generally should not have this procedure done. Slight increase in the risk of cancer. This is because of the radiation involved in the test. What happens before the procedure? No preparation is needed for this procedure. What happens during the procedure? You will undress and remove any jewelry around your neck or chest. You will put on a hospital gown. Sticky electrodes will be placed on your chest. The electrodes will be connected to an electrocardiogram (ECG) machine to record a tracing of the electrical activity of your heart. A CT scanner will take pictures of your heart. During this time, you will be asked to lie still and hold your breath for 2-3 seconds while a picture of your heart is being taken. The procedure may vary among health care providers and hospitals. What happens after the procedure? You can get dressed. You can return to your normal activities. It is up to you to get the results of your test. Ask your health care provider, or the department that is doing the test, when your results will be ready. Summary A coronary calcium scan is an imaging test used to look for deposits of calcium and other fatty materials (plaques) in the inner lining of the blood vessels of the heart (coronary arteries). Generally, this is a safe procedure. Tell your health care provider if you are pregnant or may be pregnant. No preparation is needed for this procedure. A CT scanner will take pictures of your heart. You can return to your normal activities after the scan is done. This information is not intended to replace advice given to you by  your health care provider. Make sure you discuss any questions you have with your health care provider. Document Released: 12/03/2007 Document Revised: 04/25/2016 Document Reviewed: 04/25/2016 Elsevier Interactive Patient Education  2017 [employer].   Follow-Up: At [facility], you and your health needs are our priority.  As part of our  continuing mission to provide you with exceptional heart care, we have created designated Provider Care Teams.  These Care Teams include your primary Cardiologist (physician) and Advanced Practice Providers (APPs -  Physician Assistants and Nurse Practitioners) who all work together to provide you with the care you need, when you need it.  We recommend signing up for the patient portal called "MyChart".  Sign up information is provided on this After Visit Summary.  MyChart is used to connect with patients for Virtual Visits (Telemedicine).  Patients are able to view lab/test results, encounter notes, upcoming appointments, etc.  Non-urgent messages can be sent to your provider as well.   To learn more about what you can do with MyChart, go to [email or web address].    Your next appointment:   12 month(s)  Provider:   You may see Julien Nordmann, MD or one of the following Advanced Practice Providers on your designated Care Team:   Nicolasa Ducking, NP Eula Listen, PA-C Cadence Fransico Michael, PA-C Charlsie Quest, NP

## 2023-02-24 ENCOUNTER — Other Ambulatory Visit: Payer: Self-pay

## 2023-02-24 ENCOUNTER — Ambulatory Visit
Admission: RE | Admit: 2023-02-24 | Discharge: 2023-02-24 | Disposition: A | Discharge status: Home / Self Care | Payer: Medicare Other | Source: Ambulatory Visit | Attending: Physician Assistant | Admitting: Physician Assistant

## 2023-02-24 DIAGNOSIS — I2584 Coronary atherosclerosis due to calcified coronary lesion: Secondary | ICD-10-CM | POA: Insufficient documentation

## 2023-02-24 DIAGNOSIS — I2510 Atherosclerotic heart disease of native coronary artery without angina pectoris: Secondary | ICD-10-CM | POA: Insufficient documentation

## 2023-02-24 DIAGNOSIS — E785 Hyperlipidemia, unspecified: Secondary | ICD-10-CM

## 2023-02-24 MED ORDER — ROSUVASTATIN CALCIUM 10 MG PO TABS
10.0000 mg | ORAL_TABLET | Freq: Every day | ORAL | 3 refills | Status: DC
Start: 2023-02-24 — End: 2023-04-25

## 2023-02-24 MED ORDER — ASPIRIN 81 MG PO TBEC: 81.0000 mg | DELAYED_RELEASE_TABLET | Freq: Every day | ORAL | Status: AC

## 2023-04-19 ENCOUNTER — Ambulatory Visit: Payer: Medicare Other | Admitting: Internal Medicine

## 2023-04-25 ENCOUNTER — Encounter: Payer: Self-pay | Admitting: Internal Medicine

## 2023-04-25 ENCOUNTER — Ambulatory Visit (INDEPENDENT_AMBULATORY_CARE_PROVIDER_SITE_OTHER): Payer: Medicare Other | Admitting: Internal Medicine

## 2023-04-25 VITALS — BP 122/66 | HR 77 | Ht 72.0 in | Wt 192.4 lb

## 2023-04-25 DIAGNOSIS — Z125 Encounter for screening for malignant neoplasm of prostate: Secondary | ICD-10-CM

## 2023-04-25 DIAGNOSIS — I10 Essential (primary) hypertension: Secondary | ICD-10-CM | POA: Diagnosis not present

## 2023-04-25 DIAGNOSIS — Z0000 Encounter for general adult medical examination without abnormal findings: Secondary | ICD-10-CM | POA: Diagnosis not present

## 2023-04-25 DIAGNOSIS — R5383 Other fatigue: Secondary | ICD-10-CM

## 2023-04-25 DIAGNOSIS — E782 Mixed hyperlipidemia: Secondary | ICD-10-CM | POA: Diagnosis not present

## 2023-04-25 DIAGNOSIS — I77810 Thoracic aortic ectasia: Secondary | ICD-10-CM | POA: Insufficient documentation

## 2023-04-25 DIAGNOSIS — R7301 Impaired fasting glucose: Secondary | ICD-10-CM | POA: Diagnosis not present

## 2023-04-25 DIAGNOSIS — S4991XS Unspecified injury of right shoulder and upper arm, sequela: Secondary | ICD-10-CM

## 2023-04-25 LAB — COMPREHENSIVE METABOLIC PANEL WITH GFR
ALT: 23 U/L (ref 0–53)
AST: 17 U/L (ref 0–37)
Albumin: 4.4 g/dL (ref 3.5–5.2)
Alkaline Phosphatase: 69 U/L (ref 39–117)
BUN: 22 mg/dL (ref 6–23)
CO2: 30 meq/L (ref 19–32)
Calcium: 9.4 mg/dL (ref 8.4–10.5)
Chloride: 102 meq/L (ref 96–112)
Creatinine, Ser: 0.7 mg/dL (ref 0.40–1.50)
GFR: 94.85 mL/min
Glucose, Bld: 98 mg/dL (ref 70–99)
Potassium: 4.8 meq/L (ref 3.5–5.1)
Sodium: 139 meq/L (ref 135–145)
Total Bilirubin: 0.4 mg/dL (ref 0.2–1.2)
Total Protein: 6.5 g/dL (ref 6.0–8.3)

## 2023-04-25 LAB — LIPID PANEL
Cholesterol: 184 mg/dL (ref 0–200)
HDL: 41.5 mg/dL (ref >39.00)
LDL Cholesterol: 101 mg/dL — ABNORMAL HIGH (ref 0–99)
NonHDL: 142.96
Total CHOL/HDL Ratio: 4
Triglycerides: 209 mg/dL — ABNORMAL HIGH (ref 0.0–149.0)
VLDL: 41.8 mg/dL — ABNORMAL HIGH (ref 0.0–40.0)

## 2023-04-25 LAB — MICROALBUMIN / CREATININE URINE RATIO
Creatinine,U: 102.8 mg/dL
Microalb Creat Ratio: 0.7 mg/g (ref 0.0–30.0)
Microalb, Ur: <0.7 mg/dL (ref 0.0–1.9)

## 2023-04-25 LAB — TSH: TSH: 1.29 u[IU]/mL (ref 0.35–5.50)

## 2023-04-25 LAB — LDL CHOLESTEROL, DIRECT: Direct LDL: 99 mg/dL

## 2023-04-25 LAB — PSA, MEDICARE: PSA: 1.1 ng/mL (ref 0.10–4.00)

## 2023-04-25 MED ORDER — TELMISARTAN 40 MG PO TABS: 40.0000 mg | ORAL_TABLET | Freq: Every day | ORAL | 1 refills | Status: DC

## 2023-04-25 NOTE — Assessment & Plan Note (Signed)
He has met with Francena Hanly ad has deferred shoulder replacement for now as he is minimally limited by his rotator cuff syndrome

## 2023-04-25 NOTE — Assessment & Plan Note (Signed)
Stable by recent coronary CT.  Advised to Continue annual imaging with CTA  and keep BP < 120/70

## 2023-04-25 NOTE — Assessment & Plan Note (Signed)

## 2023-04-25 NOTE — Progress Notes (Signed)
Patient ID: Danny Navarro, male    DOB: Oct 22, 1955  Age: 67 y.o. MRN: [id number]  The patient is here for follow up and  management of other chronic and acute problems.   The risk factors are reflected in the social history.   The roster of all physicians providing medical care to patient - is listed in the Snapshot section of the chart.   Activities of daily living:  The patient is 100% independent in all ADLs: dressing, toileting, feeding as well as independent mobility   Home safety : The patient has smoke detectors in the home. They wear seatbelts.  There are no unsecured firearms at home. There is no violence in the home.    There is no risks for hepatitis, STDs or HIV. There is no   history of blood transfusion. They have no travel history to infectious disease endemic areas of the world.   The patient has seen their dentist in the last six month. They have seen their eye doctor in the last year. The patinet  denies slight hearing difficulty with regard to whispered voices and some television programs.  They have deferred audiologic testing in the last year.  They do not  have excessive sun exposure. Discussed the need for sun protection: hats, long sleeves and use of sunscreen if there is significant sun exposure.    Diet: the importance of a healthy diet is discussed. They do have a healthy diet.   The benefits of regular aerobic exercise were discussed. The patient  cycles over 100 miles every week    Depression screen: there are no signs or vegative symptoms of depression- irritability, change in appetite, anhedonia, sadness/tearfullness.   The following portions of the patient's history were reviewed and updated as appropriate: allergies, current medications, past family history, past medical history,  past surgical history, past social history  and problem list.   Visual acuity was not assessed per patient preference since the patient has regular follow up with an  ophthalmologist.  Hearing and body mass index were assessed and reviewed.    During the course of the visit the patient was educated and counseled about appropriate screening and preventive services including : fall prevention , diabetes screening, nutrition counseling, colorectal cancer screening, and recommended immunizations.    Chief Complaint:  1) HTN:  Patient is taking telmisartan  as prescribed and notes no adverse effects.  Home BP readings have been done about once per week and are  generally < 120/70.  He  is avoiding added salt in her diet and execsing 5 days per week  minimum  .   Review of Symptoms  Patient denies headache, fevers, malaise, unintentional weight loss, skin rash, eye pain, sinus congestion and sinus pain, sore throat, dysphagia,  hemoptysis , cough, dyspnea, wheezing, chest pain, palpitations, orthopnea, edema, abdominal pain, nausea, melena, diarrhea, constipation, flank pain, dysuria, hematuria, urinary  Frequency, nocturia, numbness, tingling, seizures,  Focal weakness, Loss of consciousness,  Tremor, insomnia, depression, anxiety, and suicidal ideation.    Physical Exam:  BP 122/66   Pulse 77   Ht 6' (1.829 m)   Wt 192 lb 6.4 oz (87.3 kg)   SpO2 98%   BMI 26.09 kg/m    Physical Exam Vitals reviewed.  Constitutional:      General: He is not in acute distress.    Appearance: Normal appearance. He is normal weight. He is not ill-appearing, toxic-appearing or diaphoretic.  HENT:     Head: Normocephalic and  atraumatic.     Right Ear: Tympanic membrane, ear canal and external ear normal. There is no impacted cerumen.     Left Ear: Tympanic membrane, ear canal and external ear normal. There is no impacted cerumen.     Nose: Nose normal.     Mouth/Throat:     Mouth: Mucous membranes are moist.     Pharynx: Oropharynx is clear.  Eyes:     General: No scleral icterus.       Right eye: No discharge.        Left eye: No discharge.     Conjunctiva/sclera: Conjunctivae normal.   Neck:     Thyroid: No thyromegaly.     Vascular: No carotid bruit or JVD.  Cardiovascular:     Rate and Rhythm: Normal rate and regular rhythm.     Heart sounds: Normal heart sounds.  Pulmonary:     Effort: Pulmonary effort is normal. No respiratory distress.     Breath sounds: Normal breath sounds.  Abdominal:     General: Bowel sounds are normal.     Palpations: Abdomen is soft. There is no mass.     Tenderness: There is no abdominal tenderness. There is no guarding or rebound.  Musculoskeletal:        General: Normal range of motion.     Cervical back: Normal range of motion and neck supple.  Lymphadenopathy:     Cervical: No cervical adenopathy.  Skin:    General: Skin is warm and dry.  Neurological:     General: No focal deficit present.     Mental Status: He is alert and oriented to person, place, and time. Mental status is at baseline.  Psychiatric:        Mood and Affect: Mood normal.        Behavior: Behavior normal.        Thought Content: Thought content normal.        Judgment: Judgment normal.    Assessment and Plan: Mixed hyperlipidemia Assessment & Plan: Continue crestor  for goal LDL < 100.  Marland Kitchen  Recent repeat oronary calcium score reviewed,  it has been decreased by statin therapy    .  Orders: -     Lipid panel -     LDL cholesterol, direct  Essential hypertension, benign -     Comprehensive metabolic panel -     Microalbumin / creatinine urine ratio  Impaired fasting glucose -     Comprehensive metabolic panel  Prostate cancer screening -     PSA, Medicare  Other fatigue -     TSH  Routine general medical examination at a health care facility Assessment & Plan: age appropriate education and counseling updated, referrals for preventative services and immunizations addressed, dietary and smoking counseling addressed, most recent labs reviewed.  I have personally reviewed and have noted:   1) the patient's medical and social history 2) The  pt's use of alcohol, tobacco, and illicit drugs 3) The patient's current medications and supplements 4) Functional ability including ADL's, fall risk, home safety risk, hearing and visual impairment 5) Diet and physical activities 6) Evidence for depression or mood disorder 7) The patient's height, weight, and BMI have been recorded in the chart     I have made referrals, and provided counseling and education based on review of the above    Right shoulder injury, sequela Assessment & Plan: He has met with Francena Hanly ad has deferred shoulder replacement for now as  he is minimally limited by his rotator cuff syndrome    Thoracic aortic ectasia (HCC)  Other orders -     Telmisartan; Take 1 tablet (40 mg total) by mouth daily.  Dispense: 90 tablet; Refill: 1    No follow-ups on file.  Sherlene Shams, MD

## 2023-04-25 NOTE — Assessment & Plan Note (Signed)
Continue crestor  for goal LDL < 100.  Marland Kitchen  Recent repeat oronary calcium score reviewed,  it has been decreased by statin therapy    .

## 2023-06-02 ENCOUNTER — Encounter: Payer: Self-pay | Admitting: Physician Assistant

## 2023-06-02 ENCOUNTER — Ambulatory Visit: Payer: Medicare Other | Admitting: Physician Assistant

## 2023-06-02 VITALS — BP 165/92 | HR 92 | Ht 72.0 in | Wt 180.0 lb

## 2023-06-02 DIAGNOSIS — R1031 Right lower quadrant pain: Secondary | ICD-10-CM

## 2023-06-02 DIAGNOSIS — M12811 Other specific arthropathies, not elsewhere classified, right shoulder: Secondary | ICD-10-CM | POA: Insufficient documentation

## 2023-06-02 LAB — URINALYSIS, COMPLETE
Bilirubin, UA: NEGATIVE
Glucose, UA: NEGATIVE
Ketones, UA: NEGATIVE
Leukocytes,UA: NEGATIVE
Nitrite, UA: NEGATIVE
Protein,UA: NEGATIVE
Specific Gravity, UA: 1.015 (ref 1.005–1.030)
Urobilinogen, Ur: 1 mg/dL (ref 0.2–1.0)
pH, UA: 7 (ref 5.0–7.5)

## 2023-06-02 LAB — MICROSCOPIC EXAMINATION: Bacteria, UA: NONE SEEN

## 2023-06-02 MED ORDER — CELECOXIB 100 MG PO CAPS: 100.0000 mg | ORAL_CAPSULE | Freq: Two times a day (BID) | ORAL | 0 refills | Status: AC

## 2023-06-02 NOTE — Progress Notes (Signed)
06/02/2023 2:56 PM   Danny Navarro Jul 22, 1955 [id number]  CC: Chief Complaint  Patient presents with   Follow-up   Groin Pain   HPI: Danny Navarro is a 67 y.o. male with PMH microscopic hematuria with benign workup in 2021, back surgery x 2, and chronic back pain managed at [facility] in [place] who presents today for evaluation of groin pain.   Today he reports an approximate 2-week history of right inguinal pain that radiates into the right testicle.  He denies lumps, bumps, bulges, penile discharge, testicular swelling, or change in his voiding symptoms.  No history of hernias.  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  In-office UA today positive for trace lysed blood; urine microscopy pan negative.  PMH: Past Medical History:  Diagnosis Date   Anxiety    Hyperlipidemia    Hypertension    Screening 06/04/2008   GXT, walked 15:25. No CO or ECG changes    Surgical History: Past Surgical History:  Procedure Laterality Date   BACK SURGERY  1998   x2    Home Medications:  Allergies as of 06/02/2023       Reactions   Other    Declomycin [demeclocycline] Rash        Medication List        Accurate as of June 02, 2023  2:56 PM. If you have any questions, ask your nurse or doctor.          STOP taking these medications    Coenzyme Q10 10 MG capsule Stopped by: Carman Ching       TAKE these medications    ascorbic acid 500 MG tablet Commonly known as: VITAMIN C Take 500 mg by mouth daily.   aspirin EC 81 MG tablet Take 1 tablet (81 mg total) by mouth daily. Swallow whole.   Multi-Vitamin tablet Take by mouth.   rosuvastatin 5 MG tablet Commonly known as: CRESTOR Take 5 mg by mouth daily.   telmisartan 40 MG tablet Commonly known as: MICARDIS Take 1 tablet (40 mg total) by mouth daily.        Allergies:  Allergies  Allergen Reactions   Other    Declomycin [Demeclocycline]  Rash    Family History: Family History  Problem Relation Age of Onset   Coronary artery disease Mother    Kidney disease Mother    Heart disease Mother    Heart attack Mother    Cancer Father    Multiple myeloma Father     Social History:   reports that he quit smoking about 44 years ago. His smoking use included cigarettes. He has been exposed to tobacco smoke. He has never used smokeless tobacco. He reports that he does not drink alcohol and does not use drugs.  Physical Exam: BP (!) 165/92   Pulse 92   Ht 6' (1.829 m)   Wt 180 lb (81.6 kg)   BMI 24.41 kg/m   Constitutional:  Alert and oriented, no acute distress, nontoxic appearing HEENT: , AT Cardiovascular: No clubbing, cyanosis, or edema Respiratory: Normal respiratory effort, no increased work of breathing GU: Bilateral descended testicles without nodules or masses.  No scrotal erythema or edema.  No significant enlargement or tenderness of the bilateral epididymides.  No inguinal lymphadenopathy. Skin: No rashes, bruises or suspicious lesions Neurologic: Grossly intact, no focal deficits, moving all 4 extremities Psychiatric: Normal mood and affect  Laboratory Data: Results for orders placed or performed  in visit on 04/25/23  Lipid Profile   Collection Time: 04/25/23 10:27 AM  Result Value Ref Range   Cholesterol 184 0 - 200 mg/dL   Triglycerides 213.0 (H) 0.0 - 149.0 mg/dL   HDL 86.57 >84.69 mg/dL   VLDL 62.9 (H) 0.0 - 52.8 mg/dL   LDL Cholesterol 413 (H) 0 - 99 mg/dL   Total CHOL/HDL Ratio 4    NonHDL 142.96   Direct LDL   Collection Time: 04/25/23 10:27 AM  Result Value Ref Range   Direct LDL 99.0 mg/dL  Comp Met (CMET)   Collection Time: 04/25/23 10:27 AM  Result Value Ref Range   Sodium 139 135 - 145 mEq/L   Potassium 4.8 3.5 - 5.1 mEq/L   Chloride 102 96 - 112 mEq/L   CO2 30 19 - 32 mEq/L   Glucose, Bld 98 70 - 99 mg/dL   BUN 22 6 - 23 mg/dL   Creatinine, Ser 2.44 0.40 - 1.50 mg/dL   Total  Bilirubin 0.4 0.2 - 1.2 mg/dL   Alkaline Phosphatase 69 39 - 117 U/L   AST 17 0 - 37 U/L   ALT 23 0 - 53 U/L   Total Protein 6.5 6.0 - 8.3 g/dL   Albumin 4.4 3.5 - 5.2 g/dL   GFR 01.02 >72.53 mL/min   Calcium 9.4 8.4 - 10.5 mg/dL  Urine Microalbumin w/creat. ratio   Collection Time: 04/25/23 10:27 AM  Result Value Ref Range   Microalb, Ur <0.7 0.0 - 1.9 mg/dL   Creatinine,U 664.4 mg/dL   Microalb Creat Ratio 0.7 0.0 - 30.0 mg/g  TSH   Collection Time: 04/25/23 10:27 AM  Result Value Ref Range   TSH 1.29 0.35 - 5.50 uIU/mL  PSA, Medicare ( [facility] only)   Collection Time: 04/25/23 10:27 AM  Result Value Ref Range   PSA 1.10 0.10 - 4.00 ng/ml   Assessment & Plan:   1. Right groin pain (Primary) UA is bland with notable resolution of previously seen microscopic hematuria.  Physical exam with no significant findings.  Will pursue scrotal ultrasound for further evaluation.  We discussed that if his scrotal ultrasound is negative, I suspect his pain is musculoskeletal in nature and associated with his known history of chronic back pain.  We discussed following up with Ortho if ultrasound is negative.  We also discussed trying a prostate friendly bike saddle for cycling.  Will send in 2 weeks of Celebrex for symptom control.  He is in agreement with this plan. - Urinalysis, Complete - US SCROTUM W/DOPPLER; Future - celecoxib (CELEBREX) 100 MG capsule; Take 1 capsule (100 mg total) by mouth 2 (two) times daily for 14 days.  Dispense: 28 capsule; Refill: 0   Return for will call with results.  Carman Ching, PA-C  [facility] [place] [address] [place], [place] [address] [phone]

## 2023-08-16 DIAGNOSIS — M7741 Metatarsalgia, right foot: Secondary | ICD-10-CM | POA: Diagnosis not present

## 2023-08-16 DIAGNOSIS — L84 Corns and callosities: Secondary | ICD-10-CM | POA: Diagnosis not present

## 2023-08-16 IMAGING — CT CT CARDIAC CORONARY ARTERY CALCIUM SCORE
3 series · 14 of 20 positions shown, 16 images · non-contrast
Comparison: Cardiac CT 06/06/2018.
COMPARISON: Cardiac CT 06/06/2018.

Addendum:
CLINICAL DATA: Dilated aorta on prior scan. Asymptomatic.
Hypertension and hyperlipidemia.
CLINICAL DATA: Risk stratification

EXAM:
Coronary Calcium Score
TECHNIQUE: The patient was scanned on a Siemens Somatom go.Top Scanner. Axial
non-contrast 3 mm slices were carried out through the heart. The
data set was analyzed on a dedicated work station and scored using
the Agatson method.

[Series 2: sa36 calcium scoring 3.00 · axial · 0.37mm/px · z∈[-1135,-1054]mm · 4 of 46 slices shown]
[im 10/46  vessel]
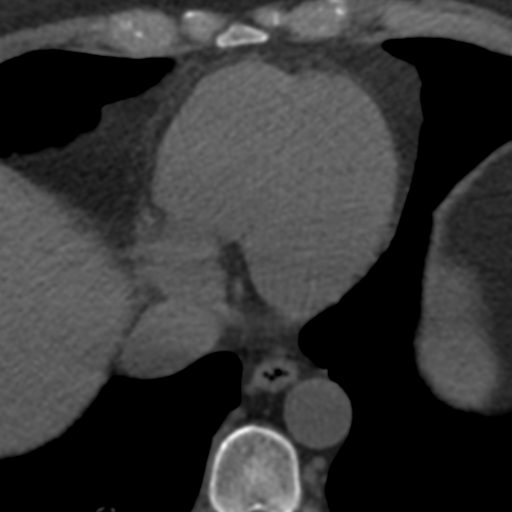
[im 19/46  vessel]
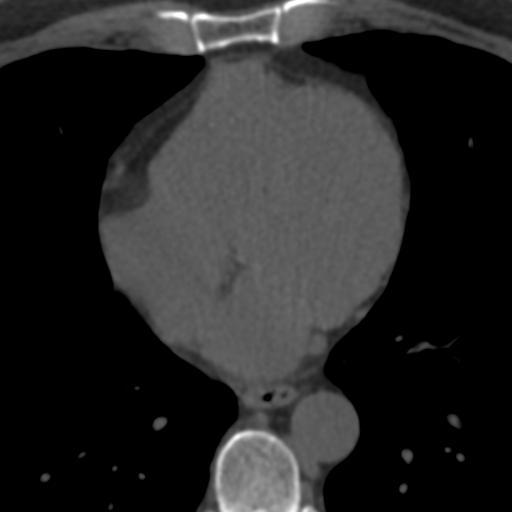
[im 28/46  vessel]
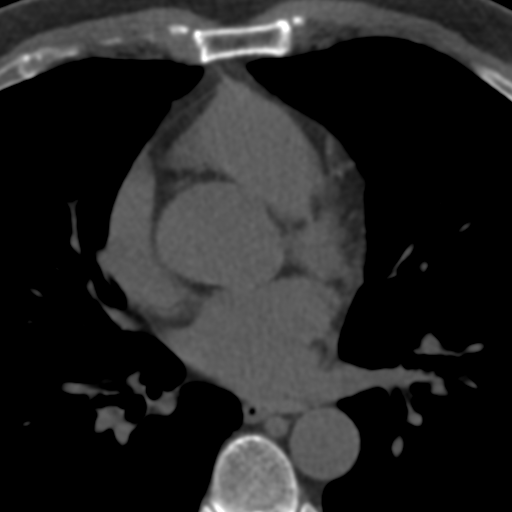
[im 37/46  vessel]
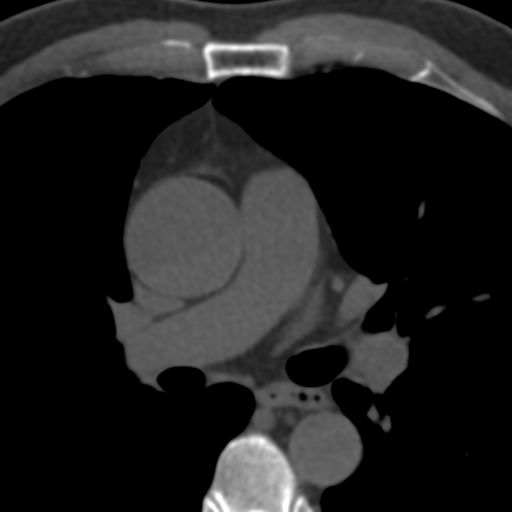

[Series 5: full fov st calcium scoring 3.00 · axial · 0.62mm/px · z∈[-1141,-1051]mm · 5 of 46 slices shown, 7 images]
[im 8/46  vessel]
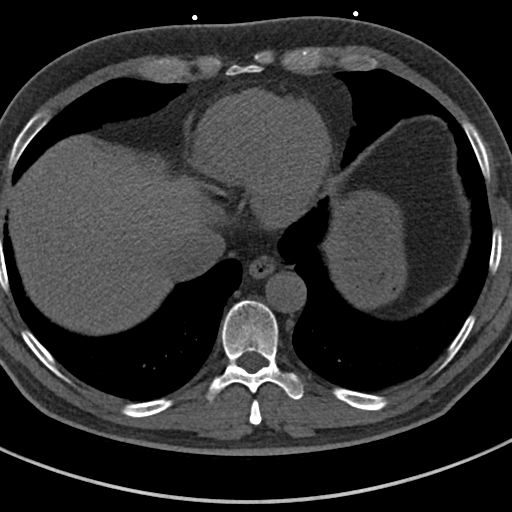
[im 8/46  lung]
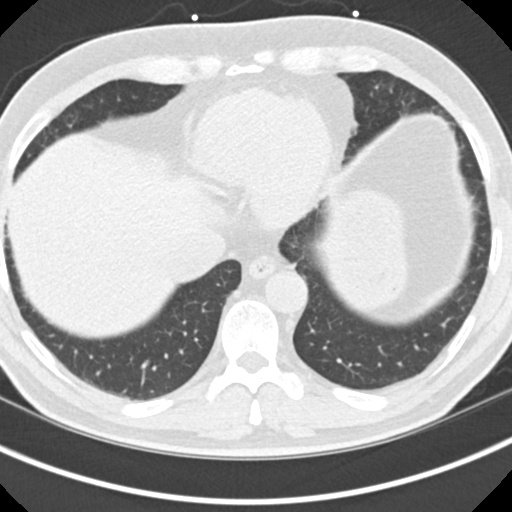
[im 16/46  vessel]
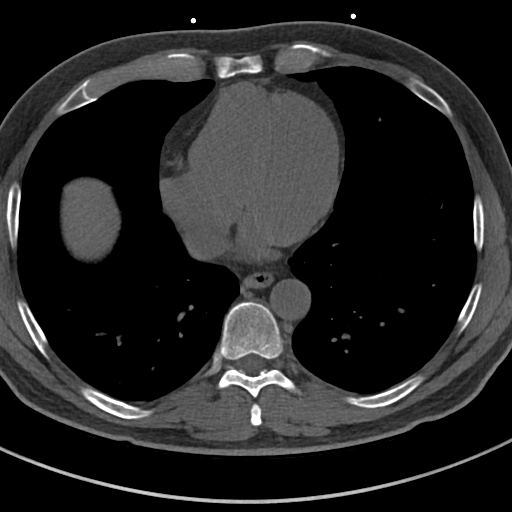
[im 23/46  vessel]
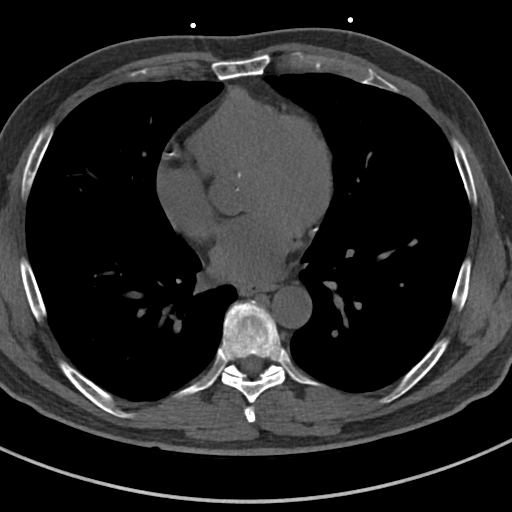
[im 31/46  vessel]
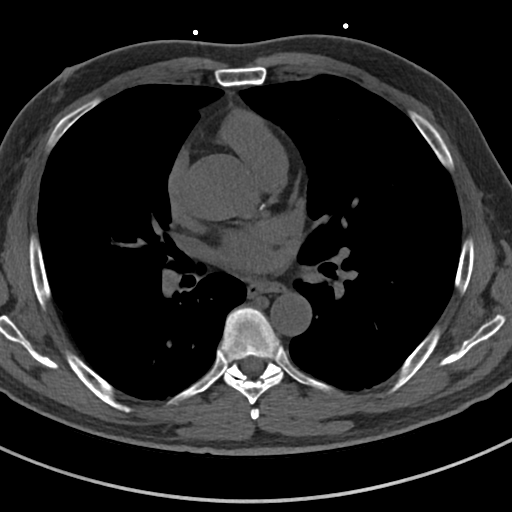
[im 38/46  vessel]
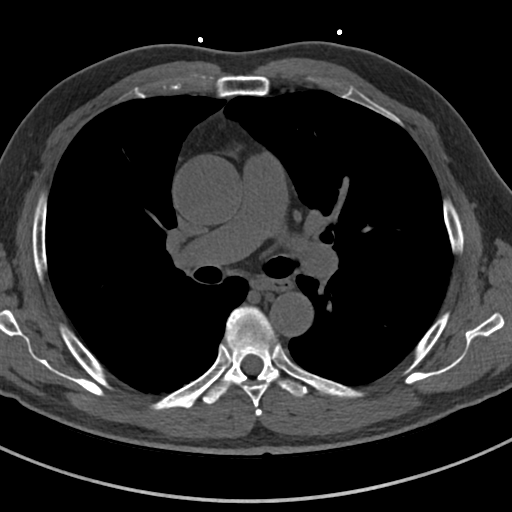
[im 38/46  lung]
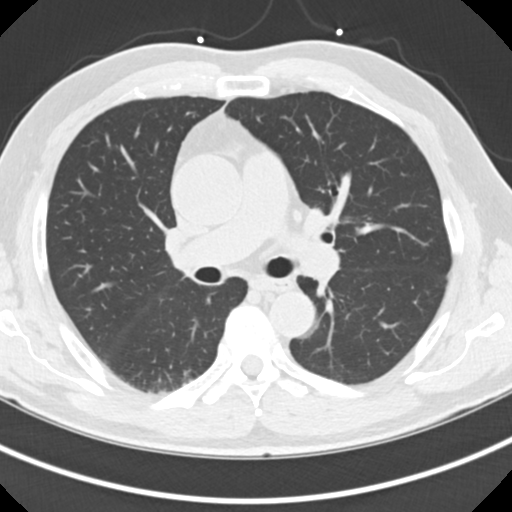

[Series 10: full fov lungs calcium scoring 3.00 ax · axial · 0.62mm/px · z∈[-1141,-1051]mm · 5 of 46 slices shown]
[im 8/46  vessel]
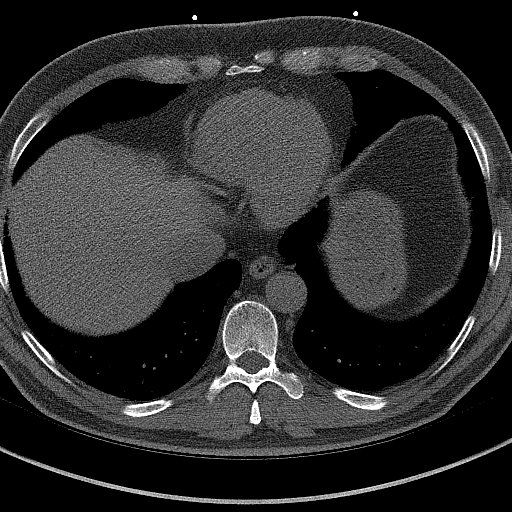
[im 16/46  vessel]
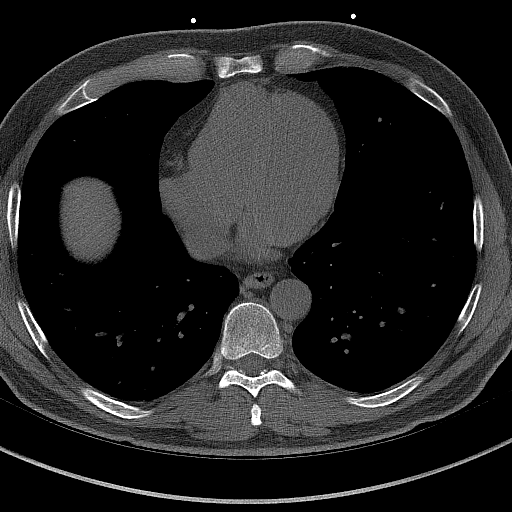
[im 23/46  vessel]
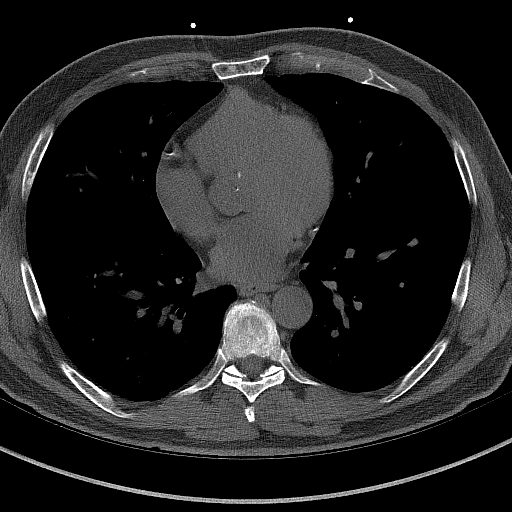
[im 31/46  vessel]
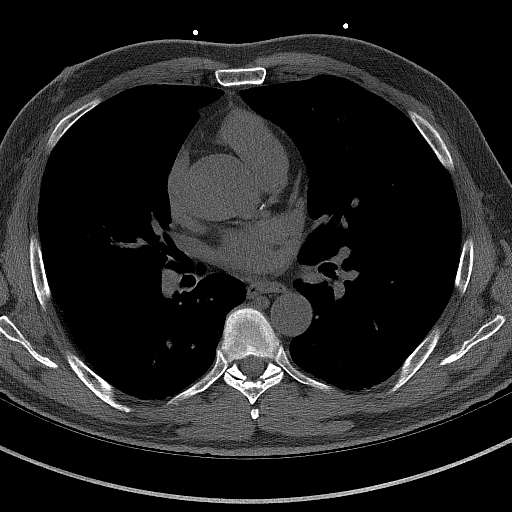
[im 38/46  vessel]
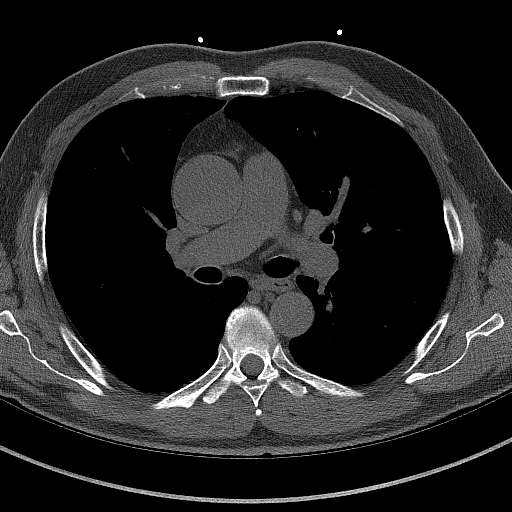

[14 of 20 positions shown; findings below may reference images not displayed]

EXAM:
OVER-READ INTERPRETATION  CT CHEST

The following report is an over-read performed by radiologist Dr.
Tamara Mayre Tefy [REDACTED] on 11/09/2021. This over-read
does not include interpretation of cardiac or coronary anatomy or
pathology. The coronary calcium score interpretation by the
cardiologist is attached.
FINDINGS: Cardiovascular: The ascending aorta measures up to 4.1 x 4.2 cm
(transverse by AP), not significantly changed from prior and again
borderline aneurysmal.

Mediastinum/Nodes: There are no enlarged lymph nodes within the
visualized mediastinum.

Lungs/Pleura: There is no pleural effusion. Mild bilateral posterior
dependent subsegmental lower lobe ground-glass subsegmental
atelectasis.

Upper abdomen: No significant findings in the visualized upper
abdomen.

Musculoskeletal/Chest wall: Mild degenerative disc changes of the
midthoracic spine.
IMPRESSION: Stable 4.2 cm ascending aortic aneurysm compared to 06/06/2018.
Recommend annual imaging followup by CTA or MRA. This recommendation
follows 4919 ACCF/AHA/AATS/ACR/ASA/SCA/AHSAN/MARIO SUZANA/TIGER/AMNON Guidelines
for the Diagnosis and Management of Patients with Thoracic Aortic
Disease. Circulation. 4919; 121: E266-e369. Aortic aneurysm NOS
(SUVRP-G08.Z)
FINDINGS: Non-cardiac: See separate report from [REDACTED].

Ascending Aorta: Normal size

Pericardium: Normal

Coronary arteries: Normal origin of left and right coronary
arteries. Distribution of arterial calcifications if present, as
noted below;

LM 0

LAD

LCx

RCA 157

Total 190

IMPRESSION AND RECOMMENDATION:
1. Coronary calcium score of 190. This was 64th percentile for age
and sex matched control.

2.  CAC 100-299 in LAD, LCx, RCA   WEBER-DOFFING LANDEIRA2/N3.

3.  Recommend aspirin and statin if no contraindications.

4.  Continue heart healthy lifestyle and risk factor modification.

*** End of Addendum ***
EXAM:
OVER-READ INTERPRETATION  CT CHEST

The following report is an over-read performed by radiologist Dr.
Tamara Mayre Tefy [REDACTED] on 11/09/2021. This over-read
does not include interpretation of cardiac or coronary anatomy or
pathology. The coronary calcium score interpretation by the
cardiologist is attached.
FINDINGS: Cardiovascular: The ascending aorta measures up to 4.1 x 4.2 cm
(transverse by AP), not significantly changed from prior and again
borderline aneurysmal.

Mediastinum/Nodes: There are no enlarged lymph nodes within the
visualized mediastinum.

Lungs/Pleura: There is no pleural effusion. Mild bilateral posterior
dependent subsegmental lower lobe ground-glass subsegmental
atelectasis.

Upper abdomen: No significant findings in the visualized upper
abdomen.

Musculoskeletal/Chest wall: Mild degenerative disc changes of the
midthoracic spine.
IMPRESSION: Stable 4.2 cm ascending aortic aneurysm compared to 06/06/2018.
Recommend annual imaging followup by CTA or MRA. This recommendation
follows 4919 ACCF/AHA/AATS/ACR/ASA/SCA/AHSAN/MARIO SUZANA/TIGER/AMNON Guidelines
for the Diagnosis and Management of Patients with Thoracic Aortic
Disease. Circulation. 4919; 121: [id number]. Aortic aneurysm NOS
(SUVRP-G08.Z)

## 2023-10-23 ENCOUNTER — Encounter: Payer: Self-pay | Admitting: Internal Medicine

## 2023-10-23 ENCOUNTER — Ambulatory Visit (INDEPENDENT_AMBULATORY_CARE_PROVIDER_SITE_OTHER): Payer: Medicare Other | Admitting: Internal Medicine

## 2023-10-23 VITALS — BP 130/80 | HR 88 | Ht 72.0 in | Wt 192.0 lb

## 2023-10-23 DIAGNOSIS — I10 Essential (primary) hypertension: Secondary | ICD-10-CM | POA: Diagnosis not present

## 2023-10-23 DIAGNOSIS — R7301 Impaired fasting glucose: Secondary | ICD-10-CM

## 2023-10-23 DIAGNOSIS — E782 Mixed hyperlipidemia: Secondary | ICD-10-CM

## 2023-10-23 DIAGNOSIS — I700 Atherosclerosis of aorta: Secondary | ICD-10-CM

## 2023-10-23 DIAGNOSIS — Z0000 Encounter for general adult medical examination without abnormal findings: Secondary | ICD-10-CM | POA: Diagnosis not present

## 2023-10-23 DIAGNOSIS — R5383 Other fatigue: Secondary | ICD-10-CM

## 2023-10-23 LAB — COMPREHENSIVE METABOLIC PANEL WITH GFR
ALT: 26 U/L (ref 0–53)
AST: 19 U/L (ref 0–37)
Albumin: 4.7 g/dL (ref 3.5–5.2)
Alkaline Phosphatase: 68 U/L (ref 39–117)
BUN: 18 mg/dL (ref 6–23)
CO2: 28 meq/L (ref 19–32)
Calcium: 9.3 mg/dL (ref 8.4–10.5)
Chloride: 101 meq/L (ref 96–112)
Creatinine, Ser: 0.68 mg/dL (ref 0.40–1.50)
GFR: 95.35 mL/min (ref >60.00)
Glucose, Bld: 99 mg/dL (ref 70–99)
Potassium: 4.4 meq/L (ref 3.5–5.1)
Sodium: 137 meq/L (ref 135–145)
Total Bilirubin: 0.5 mg/dL (ref 0.2–1.2)
Total Protein: 6.7 g/dL (ref 6.0–8.3)

## 2023-10-23 LAB — LIPID PANEL
Cholesterol: 153 mg/dL (ref 0–200)
HDL: 44.4 mg/dL (ref >39.00)
LDL Cholesterol: 83 mg/dL (ref 0–99)
NonHDL: 108.53
Total CHOL/HDL Ratio: 3
Triglycerides: 129 mg/dL (ref 0.0–149.0)
VLDL: 25.8 mg/dL (ref 0.0–40.0)

## 2023-10-23 LAB — LDL CHOLESTEROL, DIRECT: Direct LDL: 80 mg/dL

## 2023-10-23 MED ORDER — DOXYCYCLINE HYCLATE 100 MG PO TABS: 100.0000 mg | ORAL_TABLET | Freq: Two times a day (BID) | ORAL | 0 refills | Status: DC

## 2023-10-23 MED ORDER — PREDNISONE 10 MG PO TABS: ORAL_TABLET | ORAL | 0 refills | Status: DC

## 2023-10-23 MED ORDER — TELMISARTAN 40 MG PO TABS: 40.0000 mg | ORAL_TABLET | Freq: Every day | ORAL | 1 refills | Status: DC

## 2023-10-23 NOTE — Assessment & Plan Note (Signed)

## 2023-10-23 NOTE — Assessment & Plan Note (Signed)
 Taking rosuvastatin daily.

## 2023-10-23 NOTE — Assessment & Plan Note (Signed)
 Home readings have been < 130/80.  Usually 120/70  on telmisartan  40 mg daily

## 2023-10-23 NOTE — Progress Notes (Unsigned)
 Patient ID: Danny Navarro, male    DOB: [birth year]  Age: 68 y.o. MRN: [id number]  The patient is here for annual preventive examination and management of other chronic and acute problems.   The risk factors are reflected in the social history.  The roster of all physicians providing medical care to patient - is listed in the Snapshot section of the chart.  Activities of daily living:  The patient is 100% independent in all ADLs: dressing, toileting, feeding as well as independent mobility  Home safety : The patient has smoke detectors in the home. They wear seatbelts.  There are no firearms at home. There is no violence in the home.   There is no risks for hepatitis, STDs or HIV. There is no   history of blood transfusion. They have no travel history to infectious disease endemic areas of the world.  The patient has seen their dentist in the last six month. They have seen their eye doctor in the last year. They admit to slight hearing difficulty with regard to whispered voices and some television programs.  They have deferred audiologic testing in the last year.  They do not  have excessive sun exposure. Discussed the need for sun protection: hats, long sleeves and use of sunscreen if there is significant sun exposure.   Diet: the importance of a healthy diet is discussed. They do have a healthy diet.  The benefits of regular aerobic exercise were discussed. She walks 4 times per week ,  20 minutes.   Depression screen: there are no signs or vegative symptoms of depression- irritability, change in appetite, anhedonia, sadness/tearfullness.  Cognitive assessment: the patient manages all their financial and personal affairs and is actively engaged. They could relate day,date,year and events; recalled 2/3 objects at 3 minutes; performed clock-face test normally.  The following portions of the patient's history were reviewed and updated as appropriate: allergies, current medications, past family  history, past medical history,  past surgical history, past social history  and problem list.  Visual acuity was not assessed per patient preference since she has regular follow up with her ophthalmologist. Hearing and body mass index were assessed and reviewed.   During the course of the visit the patient was educated and counseled about appropriate screening and preventive services including : fall prevention , diabetes screening, nutrition counseling, colorectal cancer screening, and recommended immunizations.    CC: The primary encounter diagnosis was Elevated blood pressure reading in office with white coat syndrome, with diagnosis of hypertension. Diagnoses of Mixed hyperlipidemia, Impaired fasting glucose, Other fatigue, Encounter for preventative adult health care examination, Aortic atherosclerosis (HCC), and Routine general medical examination at a health care facility were also pertinent to this visit.   No issues.   History Danny Navarro has a past medical history of Anxiety, Hyperlipidemia, Hypertension, and Screening (06/04/2008).   Danny Navarro has a past surgical history that includes Back surgery (1998).   His family history includes Cancer in his father; Coronary artery disease in his mother; Heart attack in his mother; Heart disease in his mother; Kidney disease in his mother; Multiple myeloma in his father.Danny Navarro reports that Danny Navarro quit smoking about 45 years ago. His smoking use included cigarettes. Danny Navarro has been exposed to tobacco smoke. Danny Navarro has never used smokeless tobacco. Danny Navarro reports that Danny Navarro does not drink alcohol and does not use drugs.  Outpatient Medications Prior to Visit  Medication Sig Dispense Refill   aspirin  EC 81 MG tablet Take 1 tablet (81 mg total)  by mouth daily. Swallow whole.     Multiple Vitamin (MULTI-VITAMIN) tablet Take by mouth.     rosuvastatin  (CRESTOR ) 5 MG tablet Take 5 mg by mouth daily.     vitamin C (ASCORBIC ACID) 500 MG tablet Take 500 mg by mouth daily.      telmisartan  (MICARDIS ) 40 MG tablet Take 1 tablet (40 mg total) by mouth daily. 90 tablet 1   No facility-administered medications prior to visit.    Review of Systems  Patient denies headache, fevers, malaise, unintentional weight loss, skin rash, eye pain, sinus congestion and sinus pain, sore throat, dysphagia,  hemoptysis , cough, dyspnea, wheezing, chest pain, palpitations, orthopnea, edema, abdominal pain, nausea, melena, diarrhea, constipation, flank pain, dysuria, hematuria, urinary  Frequency, nocturia, numbness, tingling, seizures,  Focal weakness, Loss of consciousness,  Tremor, insomnia, depression, anxiety, and suicidal ideation.     Objective:  BP 130/80   Pulse 88   Ht 6' (1.829 m)   Wt 192 lb (87.1 kg)   SpO2 97%   BMI 26.04 kg/m   Physical Exam Vitals reviewed.  Constitutional:      General: Danny Navarro is not in acute distress.    Appearance: Normal appearance. Danny Navarro is normal weight. Danny Navarro is not ill-appearing, toxic-appearing or diaphoretic.  HENT:     Head: Normocephalic and atraumatic.     Right Ear: Tympanic membrane, ear canal and external ear normal. There is no impacted cerumen.     Left Ear: Tympanic membrane, ear canal and external ear normal. There is no impacted cerumen.     Nose: Nose normal.     Mouth/Throat:     Mouth: Mucous membranes are moist.     Pharynx: Oropharynx is clear.  Eyes:     General: No scleral icterus.       Right eye: No discharge.        Left eye: No discharge.     Conjunctiva/sclera: Conjunctivae normal.  Neck:     Thyroid : No thyromegaly.     Vascular: No carotid bruit or JVD.  Cardiovascular:     Rate and Rhythm: Normal rate and regular rhythm.     Heart sounds: Normal heart sounds.  Pulmonary:     Effort: Pulmonary effort is normal. No respiratory distress.     Breath sounds: Normal breath sounds.  Abdominal:     General: Bowel sounds are normal.     Palpations: Abdomen is soft. There is no mass.     Tenderness: There is no  abdominal tenderness. There is no guarding or rebound.  Musculoskeletal:        General: Normal range of motion.     Cervical back: Normal range of motion and neck supple.  Lymphadenopathy:     Cervical: No cervical adenopathy.  Skin:    General: Skin is warm and dry.  Neurological:     General: No focal deficit present.     Mental Status: Danny Navarro is alert and oriented to person, place, and time. Mental status is at baseline.  Psychiatric:        Mood and Affect: Mood normal.        Behavior: Behavior normal.        Thought Content: Thought content normal.        Judgment: Judgment normal.    Assessment & Plan:  Elevated blood pressure reading in office with white coat syndrome, with diagnosis of hypertension Assessment & Plan: Home readings have been < 130/80.  Usually 120/70  on telmisartan   40 mg daily   Orders: -     Comprehensive metabolic panel with GFR  Mixed hyperlipidemia -     Lipid panel -     LDL cholesterol, direct  Impaired fasting glucose  Other fatigue  Encounter for preventative adult health care examination  Aortic atherosclerosis (HCC) Assessment & Plan: Taking rosuvastatin  daily    Routine general medical examination at a health care facility Assessment & Plan: age appropriate education and counseling updated, referrals for preventative services and immunizations addressed, dietary and smoking counseling addressed, most recent labs reviewed.  I have personally reviewed and have noted:   1) the patient's medical and social history 2) The pt's use of alcohol, tobacco, and illicit drugs 3) The patient's current medications and supplements 4) Functional ability including ADL's, fall risk, home safety risk, hearing and visual impairment 5) Diet and physical activities 6) Evidence for depression or mood disorder 7) The patient's height, weight, and BMI have been recorded in the chart   I have made referrals, and provided counseling and education based on  review of the above    Other orders -     Telmisartan ; Take 1 tablet (40 mg total) by mouth daily.  Dispense: 90 tablet; Refill: 1 -     Doxycycline Hyclate; Take 1 tablet (100 mg total) by mouth 2 (two) times daily.  Dispense: 14 tablet; Refill: 0 -     predniSONE; 6 tablets on Day 1 , then reduce by 1 tablet daily until gone  Dispense: 21 tablet; Refill: 0      I provided 40 minutes of  face-to-face time during this encounter reviewing patient's current problems and past surgeries,  recent labs and imaging studies, providing counseling on the above mentioned problems , and coordination  of care .   Follow-up: Return in about 6 months (around 04/24/2024) for hypertension.   Thersia Flax, MD

## 2023-10-24 ENCOUNTER — Encounter: Payer: Self-pay | Admitting: Internal Medicine

## 2023-10-26 ENCOUNTER — Ambulatory Visit: Payer: Medicare Other | Admitting: Dermatology

## 2023-11-08 ENCOUNTER — Ambulatory Visit (INDEPENDENT_AMBULATORY_CARE_PROVIDER_SITE_OTHER): Payer: Medicare Other | Admitting: *Deleted

## 2023-11-08 VITALS — Ht 72.0 in | Wt 185.0 lb

## 2023-11-08 DIAGNOSIS — Z0000 Encounter for general adult medical examination without abnormal findings: Secondary | ICD-10-CM

## 2023-11-08 NOTE — Progress Notes (Signed)
 Subjective:   Danny Navarro is a 68 y.o. who presents for a Medicare Wellness preventive visit.  As a reminder, Annual Wellness Visits don't include a physical exam, and some assessments may be limited, especially if this visit is performed virtually. We may recommend an in-person follow-up visit with your provider if needed.  Visit Complete: Virtual I connected with  Danny Navarro on 11/08/23 by a audio enabled telemedicine application and verified that I am speaking with the correct person using two identifiers.  Patient Location: Home  Provider Location: Home Office  I discussed the limitations of evaluation and management by telemedicine. The patient expressed understanding and agreed to proceed.  Vital Signs: Because this visit was a virtual/telehealth visit, some criteria may be missing or patient reported. Any vitals not documented were not able to be obtained and vitals that have been documented are patient reported.  VideoDeclined- This patient declined Librarian, academic. Therefore the visit was completed with audio only.  Persons Participating in Visit: Patient.  AWV Questionnaire: No: Patient Medicare AWV questionnaire was not completed prior to this visit.  Cardiac Risk Factors include: advanced age (>55men, >51 women);male gender;dyslipidemia;hypertension     Objective:     Today's Vitals   11/08/23 0851  Weight: 185 lb (83.9 kg)  Height: 6' (1.829 m)   Body mass index is 25.09 kg/m.     11/08/2023    9:03 AM 11/08/2022    8:33 AM 11/02/2021   12:43 PM 04/12/2016    7:18 AM  Advanced Directives  Does Patient Have a Medical Advance Directive? Yes Yes Yes Yes  Type of Estate agent of [place];Living will Healthcare Power of [place];Living will Healthcare Power of [place];Living will Healthcare Power of [place];Living will  Does patient want to make changes to medical advance directive?  No - Patient declined  No - Patient declined   Copy of Healthcare Power of Attorney in Chart? No - copy requested No - copy requested No - copy requested     Current Medications (verified) Outpatient Encounter Medications as of 11/08/2023  Medication Sig   aspirin  EC 81 MG tablet Take 1 tablet (81 mg total) by mouth daily. Swallow whole.   Multiple Vitamin (MULTI-VITAMIN) tablet Take by mouth.   rosuvastatin  (CRESTOR ) 5 MG tablet Take 5 mg by mouth daily.   telmisartan  (MICARDIS ) 40 MG tablet Take 1 tablet (40 mg total) by mouth daily.   vitamin C (ASCORBIC ACID) 500 MG tablet Take 500 mg by mouth daily.   predniSONE  (DELTASONE ) 10 MG tablet 6 tablets on Day 1 , then reduce by 1 tablet daily until gone (Patient not taking: Reported on 11/08/2023)   [DISCONTINUED] doxycycline  (VIBRA -TABS) 100 MG tablet Take 1 tablet (100 mg total) by mouth 2 (two) times daily. (Patient not taking: Reported on 11/08/2023)   No facility-administered encounter medications on file as of 11/08/2023.    Allergies (verified) Other and Declomycin [demeclocycline]   History: Past Medical History:  Diagnosis Date   Anxiety    Hyperlipidemia    Hypertension    Screening 06/04/2008   GXT, walked 15:25. 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  Tobacco Use   Smoking status: Former    Current packs/day: 0.00    Types: Cigarettes    Quit date: 06/20/1978    Years since quitting: 45.4    Passive  exposure: Past   Smokeless tobacco: Never  Vaping Use   Vaping status: Never Used  Substance and Sexual Activity   Alcohol use: No   Drug use: No   Sexual activity: Not on file  Other Topics Concern   Not on file  Social History Narrative   Married with 2 kids   Owns his own business   Gets regular exercise   Social Drivers of Health   Financial Resource Strain: Low Risk  (11/08/2023)   Overall Financial Resource Strain (CARDIA)    Difficulty of Paying Living Expenses: Not hard at all  Food Insecurity: No Food Insecurity (11/08/2023)   Hunger Vital Sign    Worried About Running Out of Food in the Last Year: Never true    Ran Out of Food in the Last Year: Never true  Transportation Needs: No Transportation Needs (11/08/2023)   PRAPARE - Administrator, Civil Service (Medical): No    Lack of Transportation (Non-Medical): No  Physical Activity: Sufficiently Active (11/08/2023)   Exercise Vital Sign    Days of Exercise per Week: 4 days    Minutes of Exercise per Session: 60 min  Stress: No Stress Concern Present (11/08/2023)   [employer] of Occupational Health - Occupational Stress Questionnaire    Feeling of Stress : Not at all  Social Connections: Socially Integrated (11/08/2023)   Social Connection and Isolation Panel [NHANES]    Frequency of Communication with Friends and Family: More than three times a week    Frequency of Social Gatherings with Friends and Family: More than three times a week    Attends Religious Services: More than 4 times per year    Active Member of [employer] or Organizations: Yes    Attends Engineer, structural: More than 4 times per year    Marital Status: Married    Tobacco Counseling Counseling given: Not Answered    Clinical Intake:  Pre-visit preparation completed: Yes  Pain : No/denies pain     BMI - recorded: 25.09 Nutritional Status: BMI 25 -29 Overweight Nutritional Risks: None Diabetes: No  Lab Results   Component Value Date   HGBA1C 5.6 04/16/2019     How often do you need to have someone help you when you read instructions, pamphlets, or other written materials from your doctor or pharmacy?: 1 - Never  Interpreter Needed?: No  Information entered by :: R. Leela Vanbrocklin LPN   Activities of Daily Living     11/08/2023    8:52 AM  In your present state of health, do you have any difficulty performing the following activities:  Hearing? 0  Vision? 0  Comment readers at times  Difficulty concentrating or making decisions? 0  Walking or climbing stairs? 0  Dressing or bathing? 0  Doing errands, shopping? 0  Preparing Food and eating ? N  Using the Toilet? N  In the past six  months, have you accidently leaked urine? N  Do you have problems with loss of bowel control? N  Managing your Medications? N  Managing your Finances? N  Housekeeping or managing your Housekeeping? N    Patient Care Team: Thersia Flax, MD as PCP - General (Internal Medicine) Devorah Fonder, MD as PCP - Cardiology (Cardiology)  Indicate any recent Medical Services you may have received from other than [facility] providers in the past year (date may be approximate).     Assessment:    This is a routine wellness examination for [name].  Hearing/Vision screen Hearing Screening - Comments:: No issues Vision Screening - Comments:: Readers at times   Goals Addressed             This Visit's Progress    Patient Stated       Wants to continue to stay busy and active       Depression Screen     11/08/2023    8:59 AM 10/23/2023    9:10 AM 04/25/2023    9:58 AM 11/08/2022    8:27 AM 10/17/2022    8:04 AM 04/15/2022    8:14 AM 11/02/2021   12:44 PM  PHQ 2/9 Scores  PHQ - 2 Score 0 0 0 0 0 0 0  PHQ- 9 Score 0          Fall Risk     11/08/2023    8:54 AM 10/23/2023    9:10 AM 04/25/2023    9:58 AM 11/08/2022    8:28 AM 10/17/2022    8:04 AM  Fall Risk   Falls in the past year? 0 0 0 0 0  Number falls in  past yr: 0 0 0 0 0  Injury with Fall? 0 0 0 0 0  Risk for fall due to : No Fall Risks No Fall Risks No Fall Risks No Fall Risks No Fall Risks  Follow up Falls evaluation completed Falls evaluation completed Falls evaluation completed Falls evaluation completed Falls evaluation completed    MEDICARE RISK AT HOME:  Medicare Risk at Home Any stairs in or around the home?: Yes If so, are there any without handrails?: No Home free of loose throw rugs in walkways, pet beds, electrical cords, etc?: Yes Adequate lighting in your home to reduce risk of falls?: Yes Life alert?: No Use of a cane, walker or w/c?: No Grab bars in the bathroom?: No Shower chair or bench in shower?: Yes Elevated toilet seat or a handicapped toilet?: No  TIMED UP AND GO:  Was the test performed?  No  Cognitive Function: 6CIT completed        11/08/2023    9:04 AM 11/08/2022    8:30 AM  6CIT Screen  What Year? 0 points 0 points  What month? 0 points 0 points  What time? 0 points 0 points  Count back from 20 0 points 0 points  Months in reverse 0 points 0 points  Repeat phrase 0 points 0 points  Total Score 0 points 0 points    Immunizations Immunization History  Administered Date(s) Administered   Tdap 06/02/2010, 04/05/2021    Screening Tests Health Maintenance  Topic Date Due   Pneumonia Vaccine 39+ Years old (1 of 2 - PCV) Never done   Zoster Vaccines- Shingrix (1 of 2) Never done   Medicare Annual Wellness (AWV)  11/08/2023   INFLUENZA VACCINE  01/19/2024   Colonoscopy  04/12/2026   DTaP/Tdap/Td (3 - Td or  Tdap) 04/06/2031   Hepatitis C Screening  Completed   HPV VACCINES  Aged Out   Meningococcal B Vaccine  Aged Out   COVID-19 Vaccine  Discontinued    Health Maintenance  Health Maintenance Due  Topic Date Due   Pneumonia Vaccine 56+ Years old (1 of 2 - PCV) Never done   Zoster Vaccines- Shingrix (1 of 2) Never done   Medicare Annual Wellness (AWV)  11/08/2023   Health Maintenance  Items Addressed: Discussed the need to update pneumonia and shingles vaccines. Patient declines at this time.   Additional Screening:  Vision Screening: Recommended annual ophthalmology exams for early detection of glaucoma and other disorders of the eye. Up to date  Eye  Dental Screening: Recommended annual dental exams for proper oral hygiene  Community Resource Referral / Chronic Care Management: CRR required this visit?  No   CCM required this visit?  No   Plan:    I have personally reviewed and noted the following in the patient's chart:   Medical and social history Use of alcohol, tobacco or illicit drugs  Current medications and supplements including opioid prescriptions. Patient is not currently taking opioid prescriptions. Functional ability and status Nutritional status Physical activity Advanced directives List of other physicians Hospitalizations, surgeries, and ER visits in previous 12 months Vitals Screenings to include cognitive, depression, and falls Referrals and appointments  In addition, I have reviewed and discussed with patient certain preventive protocols, quality metrics, and best practice recommendations. A written personalized care plan for preventive services as well as general preventive health recommendations were provided to patient.   Felicitas Horse, LPN   09/26/8117   After Visit Summary: (MyChart) Due to this being a telephonic visit, the after visit summary with patients personalized plan was offered to patient via MyChart   Notes: Nothing significant to report at this time.

## 2023-11-08 NOTE — Patient Instructions (Signed)
 Danny Navarro , Thank you for taking time out of your busy schedule to complete your Annual Wellness Visit with me. I enjoyed our conversation and look forward to speaking with you again next year. I, as well as your care team,  appreciate your ongoing commitment to your health goals. Please review the following plan we discussed and let me know if I can assist you in the future. Your Game plan/ To Do List    Referrals: If you haven't heard from the office you've been referred to, please reach out to them at the phone provided.  Consider updating your pneumonia and shingles vaccines. Follow up Visits: Next Medicare AWV with our clinical staff: 11/12/24 @ 8:10   Have you seen your provider in the last 6 months (3 months if uncontrolled diabetes)? Yes Next Office Visit with your provider: 04/25/24  Clinician Recommendations:  Aim for 30 minutes of exercise or brisk walking, 6-8 glasses of water, and 5 servings of fruits and vegetables each day.       This is a list of the screening recommended for you and due dates:  Health Maintenance  Topic Date Due   Pneumonia Vaccine (1 of 2 - PCV) Never done   Zoster (Shingles) Vaccine (1 of 2) Never done   Flu Shot  01/19/2024   Medicare Annual Wellness Visit  11/07/2024   Colon Cancer Screening  04/12/2026   DTaP/Tdap/Td vaccine (3 - Td or Tdap) 04/06/2031   Hepatitis C Screening  Completed   HPV Vaccine  Aged Out   Meningitis B Vaccine  Aged Out   COVID-19 Vaccine  Discontinued    Advanced directives: (Copy Requested) Please bring a copy of your health care power of attorney and living will to the office to be added to your chart at your convenience. You can mail to [facility] [address] [place], [place] [address] or email to [email or web address] Advance Care Planning is important because it:  [x]  Makes sure you receive the medical care that is consistent with your values, goals, and preferences  [x]  It provides  guidance to your family and loved ones and reduces their decisional burden about whether or not they are making the right decisions based on your wishes.

## 2023-11-15 ENCOUNTER — Encounter: Payer: Self-pay | Admitting: Urology

## 2023-11-15 ENCOUNTER — Ambulatory Visit: Admitting: Urology

## 2023-11-15 VITALS — BP 158/90 | HR 102 | Ht 72.0 in | Wt 185.0 lb

## 2023-11-15 DIAGNOSIS — N5089 Other specified disorders of the male genital organs: Secondary | ICD-10-CM | POA: Diagnosis not present

## 2023-11-15 NOTE — Progress Notes (Signed)
 I, Danny Navarro, acting as a Neurosurgeon for Danny Knapp, MD., have documented all relevant documentation on the behalf of Danny Knapp, MD, as directed by Danny Knapp, MD while in the presence of Danny Knapp, MD.  Discussed the use of AI scribe software for clinical note transcription with the patient, who gave verbal consent to proceed.   11/15/2023 4:17 PM   [name] 22-Mar-1956 [id number]  Referring provider: Thersia Flax, MD [address] [place],  [place] [address]  Chief Complaint  Patient presents with   Cyst    HPI: DEMITRIUS Navarro is a 68 y.o. male called for a follow-up appointment for evaluation of perineal pain.  Previously saw Dr. Lovetta Rucks November 2021 for a lesion in the right posterior buttock region and a CT of the abdomen pelvis was performed, there was noted to be bilateral perineal nodular induration more so on the right which is commonly seen with cyclists. He has been cycling for over 20 years and scheduled a follow-up to make sure he was not doing any damage. His CT also showed a 3 cm right simple renal cyst.   PMH: Past Medical History:  Diagnosis Date   Anxiety    Hyperlipidemia    Hypertension    Screening 06/04/2008   GXT, walked 15:25. No CO or ECG changes    Surgical History: Past Surgical History:  Procedure Laterality Date   BACK SURGERY  1998   x2    Home Medications:  Allergies as of 11/15/2023       Reactions   Other    Declomycin [demeclocycline] Rash        Medication List        Accurate as of Nov 15, 2023  4:17 PM. If you have any questions, ask your nurse or doctor.          STOP taking these medications    predniSONE  10 MG tablet Commonly known as: DELTASONE  Stopped by: Danny Navarro       TAKE these medications    ascorbic acid 500 MG tablet Commonly known as: VITAMIN C Take 500 mg by mouth daily.   aspirin  EC 81 MG tablet Take 1 tablet (81 mg total) by mouth  daily. Swallow whole.   Multi-Vitamin tablet Take by mouth.   rosuvastatin  5 MG tablet Commonly known as: CRESTOR  Take 5 mg by mouth daily.   telmisartan  40 MG tablet Commonly known as: MICARDIS  Take 1 tablet (40 mg total) by mouth daily.        Allergies:  Allergies  Allergen Reactions   Other    Declomycin [Demeclocycline] Rash    Family History: Family History  Problem Relation Age of Onset   Coronary artery disease Mother    Kidney disease Mother    Heart disease Mother    Heart attack Mother    Cancer Father    Multiple myeloma Father     Social History:  reports that he quit smoking about 45 years ago. His smoking use included cigarettes. He has been exposed to tobacco smoke. He has never used smokeless tobacco. He reports that he does not drink alcohol and does not use drugs.   Physical Exam: BP (!) 158/90   Pulse (!) 102   Ht 6' (1.829 m)   Wt 185 lb (83.9 kg)   BMI 25.09 kg/m   Constitutional:  Alert and oriented, No acute distress. HEENT: [place] AT Respiratory: Normal respiratory  effort, no increased work of breathing. GU: Nodular area right posterior perineum, which is non-tender, and measures approximately 1 cm. Psychiatric: Normal mood and affect.   Assessment & Plan:    1. Perineal induration Prior CT in 2021 showed nodular induration, a common finding among cyclists. Will discuss with general surgery, as this is an a location they typically deal with. He is not interested in surgical removal of the area, and he would rather decrease his cycling. He has changed his seat, which has helped.  I have reviewed the above documentation for accuracy and completeness, and I agree with the above.   Danny Knapp, MD  [facility] [address], [address] [place], [place] [address] [phone]

## 2023-11-29 ENCOUNTER — Encounter: Payer: Self-pay | Admitting: Physician Assistant

## 2023-11-29 ENCOUNTER — Ambulatory Visit: Admitting: Physician Assistant

## 2023-11-29 VITALS — BP 145/98 | HR 56 | Ht 72.0 in | Wt 185.0 lb

## 2023-11-29 DIAGNOSIS — K2970 Gastritis, unspecified, without bleeding: Secondary | ICD-10-CM | POA: Insufficient documentation

## 2023-11-29 DIAGNOSIS — R972 Elevated prostate specific antigen [PSA]: Secondary | ICD-10-CM | POA: Insufficient documentation

## 2023-11-29 DIAGNOSIS — N411 Chronic prostatitis: Secondary | ICD-10-CM | POA: Insufficient documentation

## 2023-11-29 DIAGNOSIS — N5089 Other specified disorders of the male genital organs: Secondary | ICD-10-CM | POA: Diagnosis not present

## 2023-11-29 DIAGNOSIS — M1990 Unspecified osteoarthritis, unspecified site: Secondary | ICD-10-CM | POA: Insufficient documentation

## 2023-11-29 DIAGNOSIS — M7989 Other specified soft tissue disorders: Secondary | ICD-10-CM

## 2023-11-29 DIAGNOSIS — K649 Unspecified hemorrhoids: Secondary | ICD-10-CM | POA: Insufficient documentation

## 2023-11-29 DIAGNOSIS — M9979 Connective tissue and disc stenosis of intervertebral foramina of abdomen and other regions: Secondary | ICD-10-CM | POA: Insufficient documentation

## 2023-11-29 NOTE — Progress Notes (Signed)
11/29/2023 2:21 PM   Danny Navarro June 14, 1956 [id number]  CC: Chief Complaint  Patient presents with   Groin Pain   HPI: Danny Navarro is a 68 y.o. male with PMH microscopic hematuria with benign workup in 2021, back surgery x 2, chronic back pain, and perineal nodules associated with cycling who presents today for follow-up of his perineal nodules.   Today he reports he changed from briefs to boxers and changed his bike saddle, and his right perineal discomfort has improved, but not resolved.  He is concerned with his ongoing pain that he could be causing damage to himself by continuing to cycle.  PMH: Past Medical History:  Diagnosis Date   Anxiety    Hyperlipidemia    Hypertension    Screening 06/04/2008   GXT, walked 15:25. No CO or ECG changes    Surgical History: Past Surgical History:  Procedure Laterality Date   BACK SURGERY  1998   x2    Home Medications:  Allergies as of 11/29/2023       Reactions   Other    Declomycin [demeclocycline] Rash        Medication List        Accurate as of November 29, 2023  2:21 PM. If you have any questions, ask your nurse or doctor.          ascorbic acid 500 MG tablet Commonly known as: VITAMIN C Take 500 mg by mouth daily.   aspirin  EC 81 MG tablet Take 1 tablet (81 mg total) by mouth daily. Swallow whole.   Multi-Vitamin tablet Take by mouth.   rosuvastatin  5 MG tablet Commonly known as: CRESTOR  Take 5 mg by mouth daily.   telmisartan  40 MG tablet Commonly known as: MICARDIS  Take 1 tablet (40 mg total) by mouth daily.        Allergies:  Allergies  Allergen Reactions   Other    Declomycin [Demeclocycline] Rash    Family History: Family History  Problem Relation Age of Onset   Coronary artery disease Mother    Kidney disease Mother    Heart disease Mother    Heart attack Mother    Cancer Father    Multiple myeloma Father     Social History:   reports that he quit smoking about 45  years ago. His smoking use included cigarettes. He has been exposed to tobacco smoke. He has never used smokeless tobacco. He reports that he does not drink alcohol and does not use drugs.  Physical Exam: BP (!) 145/98   Pulse (!) 56   Ht 6' (1.829 m)   Wt 185 lb (83.9 kg)   BMI 25.09 kg/m   Constitutional:  Alert and oriented, no acute distress, nontoxic appearing HEENT: [place], AT Cardiovascular: No clubbing, cyanosis, or edema Respiratory: Normal respiratory effort, no increased work of breathing GU: Perineum is intact without erythema or drainage. Deep right perineal nodule palpable, tender but not fluctuant. Skin: No rashes, bruises or suspicious lesions Neurologic: Grossly intact, no focal deficits, moving all 4 extremities Psychiatric: Normal mood and affect  Assessment & Plan:   1. Perineal mass, male (Primary) Persistent discomfort from perineal nodule, common in cyclists. Dr. Cherylene Corrente conferred with general surgery, who had nothing to offer. I will refer to sports medicine for further assistance. - AMB referral to sports medicine  Return if symptoms worsen or fail to improve.  Kathreen Pare, PA-C  [facility] [address], [address] [place], [place]  [address] [phone]  

## 2023-12-04 ENCOUNTER — Encounter: Payer: Self-pay | Admitting: Dermatology

## 2023-12-04 ENCOUNTER — Ambulatory Visit: Admitting: Dermatology

## 2023-12-04 DIAGNOSIS — L820 Inflamed seborrheic keratosis: Secondary | ICD-10-CM

## 2023-12-04 DIAGNOSIS — L821 Other seborrheic keratosis: Secondary | ICD-10-CM

## 2023-12-04 DIAGNOSIS — L304 Erythema intertrigo: Secondary | ICD-10-CM

## 2023-12-04 DIAGNOSIS — W908XXA Exposure to other nonionizing radiation, initial encounter: Secondary | ICD-10-CM | POA: Diagnosis not present

## 2023-12-04 DIAGNOSIS — L814 Other melanin hyperpigmentation: Secondary | ICD-10-CM

## 2023-12-04 DIAGNOSIS — Z7189 Other specified counseling: Secondary | ICD-10-CM

## 2023-12-04 DIAGNOSIS — Z1283 Encounter for screening for malignant neoplasm of skin: Secondary | ICD-10-CM

## 2023-12-04 DIAGNOSIS — L249 Irritant contact dermatitis, unspecified cause: Secondary | ICD-10-CM | POA: Diagnosis not present

## 2023-12-04 DIAGNOSIS — L570 Actinic keratosis: Secondary | ICD-10-CM

## 2023-12-04 DIAGNOSIS — Z79899 Other long term (current) drug therapy: Secondary | ICD-10-CM

## 2023-12-04 DIAGNOSIS — D692 Other nonthrombocytopenic purpura: Secondary | ICD-10-CM

## 2023-12-04 DIAGNOSIS — D1801 Hemangioma of skin and subcutaneous tissue: Secondary | ICD-10-CM

## 2023-12-04 DIAGNOSIS — L578 Other skin changes due to chronic exposure to nonionizing radiation: Secondary | ICD-10-CM | POA: Diagnosis not present

## 2023-12-04 DIAGNOSIS — Z5111 Encounter for antineoplastic chemotherapy: Secondary | ICD-10-CM

## 2023-12-04 DIAGNOSIS — D229 Melanocytic nevi, unspecified: Secondary | ICD-10-CM

## 2023-12-04 MED ORDER — FLUOROURACIL 5 % EX CREA: TOPICAL_CREAM | CUTANEOUS | 2 refills | Status: AC

## 2023-12-04 NOTE — Patient Instructions (Addendum)
 Once areas treated today have healed- Start 5-fluorouracil /calcipotriene cream twice a day for 10 days to affected areas including scalp and right tragus and ear lobe, left ear helix/antihelix and right nose. Prescription sent to [facility]. Patient advised they will receive an email to purchase the medication online and have it sent to their home. Patient provided with handout reviewing treatment course and side effects and advised to call or message us  on MyChart with any concerns.     Reviewed course of treatment and expected reaction.  Patient advised to expect inflammation and crusting and advised that erosions are possible.  Patient advised to be diligent with sun protection during and after treatment. Counseled to keep medication out of reach of children and pets.   Instructions for [facility] Medications  One or more of your medications was sent to the [facility] order [facility]. You will receive an email from them and can purchase the medicine through that link. It will then be mailed to your home at the address you confirmed. If for any reason you do not receive an email from them, please check your spam folder. If you still do not find the email, please let us  know. [facility] phone number is [phone].     Recommend using Hydrocortisone 1% ointment twice daily as needed for irritation from riding bike.     Cryotherapy Aftercare  Wash gently with soap and water everyday.   Apply Vaseline Jelly daily until healed.     Recommend daily broad spectrum sunscreen SPF 30+ to sun-exposed areas, reapply every 2 hours as needed. Call for new or changing lesions.  Staying in the shade or wearing long sleeves, sun glasses (UVA+UVB protection) and wide brim hats (4-inch brim around the entire circumference of the hat) are also recommended for sun protection.     Melanoma ABCDEs  Melanoma is the most dangerous type of skin  cancer, and is the leading cause of death from skin disease.  You are more likely to develop melanoma if you: Have light-colored skin, light-colored eyes, or red or blond hair Spend a lot of time in the sun Tan regularly, either outdoors or in a tanning bed Have had blistering sunburns, especially during childhood Have a close family member who has had a melanoma Have atypical moles or large birthmarks  Early detection of melanoma is key since treatment is typically straightforward and cure rates are extremely high if we catch it early.   The first sign of melanoma is often a change in a mole or a new dark spot.  The ABCDE system is a way of remembering the signs of melanoma.  A for asymmetry:  The two halves do not match. B for border:  The edges of the growth are irregular. C for color:  A mixture of colors are present instead of an even brown color. D for diameter:  Melanomas are usually (but not always) greater than 6mm - the size of a pencil eraser. E for evolution:  The spot keeps changing in size, shape, and color.  Please check your skin once per month between visits. You can use a small mirror in front and a large mirror behind you to keep an eye on the back side or your body.   If you see any new or changing lesions before your next follow-up, please call to schedule a visit.  Please continue daily skin protection including broad spectrum sunscreen SPF 30+ to sun-exposed areas, reapplying every 2  hours as needed when you're outdoors.   Staying in the shade or wearing long sleeves, sun glasses (UVA+UVB protection) and wide brim hats (4-inch brim around the entire circumference of the hat) are also recommended for sun protection.      Due to recent changes in healthcare laws, you may see results of your pathology and/or laboratory studies on MyChart before the doctors have had a chance to review them. We understand that in some cases there may be results that are confusing or  concerning to you. Please understand that not all results are received at the same time and often the doctors may need to interpret multiple results in order to provide you with the best plan of care or course of treatment. Therefore, we ask that you please give us  2 business days to thoroughly review all your results before contacting the office for clarification. Should we see a critical lab result, you will be contacted sooner.   If You Need Anything After Your Visit  If you have any questions or concerns for your doctor, please call our main line at [phone] and press option 4 to reach your doctor's medical assistant. If no one answers, please leave a voicemail as directed and we will return your call as soon as possible. Messages left after 4 pm will be answered the following business day.   You may also send us  a message via MyChart. We typically respond to MyChart messages within 1-2 business days.  For prescription refills, please ask your pharmacy to contact our office. Our fax number is [phone].  If you have an urgent issue when the clinic is closed that cannot wait until the next business day, you can page your doctor at the number below.    Please note that while we do our best to be available for urgent issues outside of office hours, we are not available 24/7.   If you have an urgent issue and are unable to reach us , you may choose to seek medical care at your doctor's office, retail clinic, urgent care center, or emergency room.  If you have a medical emergency, please immediately call 911 or go to the emergency department.  Pager Numbers  - Dr. Bary Likes: [phone]  - Dr. Annette Barters: [phone]  - Dr. Felipe Horton: [phone]   In the event of inclement weather, please call our main line at [phone] for an update on the status of any delays or closures.  Dermatology Medication Tips: Please keep the boxes that topical medications come in in order to help keep  track of the instructions about where and how to use these. Pharmacies typically print the medication instructions only on the boxes and not directly on the medication tubes.   If your medication is too expensive, please contact our office at [phone] option 4 or send us  a message through MyChart.   We are unable to tell what your co-pay for medications will be in advance as this is different depending on your insurance coverage. However, we may be able to find a substitute medication at lower cost or fill out paperwork to get insurance to cover a needed medication.   If a prior authorization is required to get your medication covered by your insurance company, please allow us  1-2 business days to complete this process.  Drug prices often vary depending on where the prescription is filled and some pharmacies may offer cheaper prices.  The website www.goodrx.com contains coupons for medications through different pharmacies. The prices here do  not account for what the cost may be with help from insurance (it may be cheaper with your insurance), but the website can give you the price if you did not use any insurance.  - You can print the associated coupon and take it with your prescription to the pharmacy.  - You may also stop by our office during regular business hours and pick up a GoodRx coupon card.  - If you need your prescription sent electronically to a different pharmacy, notify our office through [facility] or by phone at [phone] option 4.     Si Usted Necesita Algo Despus de Su Visita  Tambin puede enviarnos un mensaje a travs de Clinical cytogeneticist. Por lo general respondemos a los mensajes de MyChart en el transcurso de 1 a 2 das hbiles.  Para renovar recetas, por favor pida a su farmacia que se ponga en contacto con nuestra oficina. Franz Jacks de fax es [place] [phone].  Si tiene un asunto urgente cuando la clnica est cerrada y que no puede esperar hasta el  siguiente da hbil, puede llamar/localizar a su doctor(a) al nmero que aparece a continuacin.   Por favor, tenga en cuenta que aunque hacemos todo lo posible para estar disponibles para asuntos urgentes fuera del horario de [place], no estamos disponibles las 24 horas del da, los 7 [address]  [address] de la [place].   Si tiene un problema urgente y no puede comunicarse con nosotros, puede optar por buscar atencin mdica  en el consultorio de su doctor(a), en una clnica privada, en un centro de atencin urgente o en una sala de emergencias.  Si tiene Engineer, drilling, por favor llame inmediatamente al 911 o vaya a la sala de emergencias.  Nmeros de bper  - Dr. Bary Likes: [phone]  - Dra. Annette Barters: [phone]  - Dr. Felipe Horton: [phone]   En caso de inclemencias del tiempo, por favor llame a Lajuan Pila principal al [phone] para una actualizacin sobre el [place] de cualquier retraso o cierre.  Consejos para la medicacin en dermatologa: Por favor, guarde las cajas en las que vienen los medicamentos de uso tpico para ayudarle a seguir las instrucciones sobre dnde y cmo usarlos. Las farmacias generalmente imprimen las instrucciones del medicamento slo en las cajas y no directamente en los tubos del [place].   Si su medicamento es muy caro, por favor, pngase en contacto con Bettyjane Brunet llamando al [phone] y presione la opcin 4 o envenos un mensaje a travs de Clinical cytogeneticist.   No podemos decirle cul ser su copago por los medicamentos por adelantado ya que esto es diferente dependiendo de la cobertura de su seguro. Sin embargo, es posible que podamos encontrar un medicamento sustituto a Audiological scientist un formulario para que el seguro cubra el medicamento que se considera necesario.   Si se requiere una autorizacin previa para que su compaa de seguros [place] su medicamento, por favor permtanos de 1 a 2 das hbiles para completar este proceso.  Los precios de los  medicamentos varan con frecuencia dependiendo del Environmental consultant de dnde se surte la receta y alguna farmacias pueden ofrecer precios ms baratos.  El sitio web www.goodrx.com tiene cupones para medicamentos de Health and safety inspector. Los precios aqu no tienen en cuenta lo que podra costar con la ayuda del seguro (puede ser ms barato con su seguro), pero el sitio web puede darle el precio si no utiliz Tourist information centre manager.  - Puede imprimir el cupn correspondiente y llevarlo con su receta a la farmacia.  -  Tambin puede pasar por nuestra oficina durante el horario de atencin regular y Education officer, museum una tarjeta de cupones de GoodRx.  - Si necesita que su receta se enve electrnicamente a una farmacia diferente, informe a nuestra oficina a travs de MyChart de [place] o por telfono llamando al [phone] y presione la opcin 4.
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
## 2023-12-05 ENCOUNTER — Ambulatory Visit: Admitting: General Surgery

## 2023-12-05 ENCOUNTER — Encounter: Payer: Self-pay | Admitting: General Surgery

## 2023-12-05 VITALS — BP 150/85 | HR 94 | Ht 72.0 in | Wt 190.0 lb

## 2023-12-05 DIAGNOSIS — R102 Pelvic and perineal pain: Secondary | ICD-10-CM

## 2023-12-05 DIAGNOSIS — L989 Disorder of the skin and subcutaneous tissue, unspecified: Secondary | ICD-10-CM

## 2023-12-05 NOTE — Patient Instructions (Addendum)
 Nothing of concern was found on exam. If the area changes or becomes worrisome before we see you again, call us  and we can order some imaging studies.    Follow-up with our office in 6 months.   Please call and ask to speak with a nurse if you develop questions or concerns.

## 2023-12-06 NOTE — Progress Notes (Signed)
 Patient ID: ISSAIH KAUS, male   DOB: May 08, 1956, 68 y.o.   MRN: [id number] CC: Perineal Mass History of Present Illness RICHAR DUNKLEE is a 68 y.o. male with past medical history as below who presents in consultation for perineal mass.  The patient reports that he is an avid cycler and he has for a long time had pain and irritation in his perineum.  He reports that he has tried several riding pants and changing his saddle.  However, he recently felt like there was a mass in the right perineum that he was concerned about.  He denies that there is been significant growth in the mass.  He denies any overlying skin changes but says that he does feel like the skin is irritated.  He denies any drainage from the area.  He has not lost any significant amount of weight.  He denies any rectal problems.  He did have a CT scan done in 2021 that I personally reviewed that showed that there was thickening in the perineum consistent with repetitive pressure to the area..  Past Medical History Past Medical History:  Diagnosis Date   Anxiety    Hyperlipidemia    Hypertension    Screening 06/04/2008   GXT, walked 15:25. No CO or ECG changes       Past Surgical History:  Procedure Laterality Date   BACK SURGERY  1998   x2    Allergies  Allergen Reactions   Declomycin [Demeclocycline] Rash    Current Outpatient Medications  Medication Sig Dispense Refill   Multiple Vitamin (MULTI-VITAMIN) tablet Take by mouth.     rosuvastatin  (CRESTOR ) 5 MG tablet Take 5 mg by mouth daily.     telmisartan  (MICARDIS ) 40 MG tablet Take 1 tablet (40 mg total) by mouth daily. 90 tablet 1   vitamin C (ASCORBIC ACID) 500 MG tablet Take 500 mg by mouth daily.     aspirin  EC 81 MG tablet Take 1 tablet (81 mg total) by mouth daily. Swallow whole. (Patient not taking: Reported on 12/05/2023)     fluorouracil  (EFUDEX ) 5 % cream Apply twice a day for 10 days to affected areas including scalp and right tragus and ear lobe, left ear  helix/antihelix and right nose 30 g 2   No current facility-administered medications for this visit.    Family History Family History  Problem Relation Age of Onset   Coronary artery disease Mother    Kidney disease Mother    Heart disease Mother    Heart attack Mother    Cancer Father    Multiple myeloma Father        Social History Social History   Tobacco Use   Smoking status: Former    Current packs/day: 0.00    Types: Cigarettes    Quit date: 06/20/1978    Years since quitting: 45.4    Passive exposure: Past   Smokeless tobacco: Never  Vaping Use   Vaping status: Never Used  Substance Use Topics   Alcohol use: No   Drug use: No        ROS Full ROS of systems performed and is otherwise negative there than what is stated in the HPI  Physical Exam Blood pressure (!) 150/85, pulse 94, height 6' (1.829 m), weight 190 lb (86.2 kg), SpO2 97%.  Alert and oriented x 3, normal work of breathing on room air, regular rate and rhythm, abdomen soft, nontender nondistended, perineal exam performed in the presence of a chaperone.  He does have some chronic thickening of the skin on his lateral perineum but there is no erythema.  Over his right ischial tuberosity there is some thickness to the subcutaneous tissue and there may be some nodularity to this that is mobile.  I do not feel any growth that is concerning.  Data Reviewed I reviewed his CT scan from 2021.  There is thickening of the subcutaneous tissue in his perineum consistent with repetitive pressure  I have personally reviewed the patient's imaging and medical records.    Assessment    68 year old who presents with perineal pain and is an avid cycler.  He is concerned that there is a mass in his perineum.  Plan    I discussed with him that there may be some nodularity to the subcutaneous tissue secondary to repetitive pressure injury.  However, I do not see or feel anything that is worrisome for any malignancy.  I  discussed with him that I do not think any kind of resection of any of the subcutaneous tissue would be helpful as this would just cause more tissue trauma.  I recommended that he continue to work with the cycling professional to see if there is a better saddle or writing pants that helped the trauma.  We will plan to see him again in 6 months to make sure that there is no significant interval changes.  A total of 45 minutes was spent reviewing the patient's chart, performing a history and physical and discussing treatment options with the patient.  Barrett Lick

## 2023-12-14 ENCOUNTER — Telehealth: Payer: Self-pay | Admitting: General Surgery

## 2023-12-14 NOTE — Telephone Encounter (Signed)
 Patient continues with pain. Wants to move forward with imaging.   Per Dr. Marinda, order CT pelvis with contrast (no po contrast needed).  Call patient to advise of appointment for CT.

## 2023-12-15 ENCOUNTER — Other Ambulatory Visit: Payer: Self-pay | Admitting: *Deleted

## 2023-12-15 DIAGNOSIS — N5089 Other specified disorders of the male genital organs: Secondary | ICD-10-CM

## 2023-12-20 ENCOUNTER — Ambulatory Visit
Admission: RE | Admit: 2023-12-20 | Discharge: 2023-12-20 | Disposition: A | Discharge status: Home / Self Care | Source: Ambulatory Visit | Attending: General Surgery | Admitting: General Surgery

## 2023-12-20 DIAGNOSIS — N5089 Other specified disorders of the male genital organs: Secondary | ICD-10-CM | POA: Diagnosis present

## 2023-12-20 DIAGNOSIS — K4090 Unilateral inguinal hernia, without obstruction or gangrene, not specified as recurrent: Secondary | ICD-10-CM | POA: Insufficient documentation

## 2023-12-20 MED ORDER — IOHEXOL 300 MG/ML  SOLN
100.0000 mL | Freq: Once | INTRAMUSCULAR | Status: AC | PRN
  Administered 2023-12-20: 100 mL via INTRAVENOUS

## 2023-12-21 ENCOUNTER — Other Ambulatory Visit: Payer: Self-pay | Admitting: Internal Medicine

## 2023-12-27 ENCOUNTER — Ambulatory Visit: Admitting: Physician Assistant

## 2024-01-03 ENCOUNTER — Telehealth: Payer: Self-pay

## 2024-01-03 NOTE — Telephone Encounter (Signed)
 Copied from CRM [id number]. Topic: Clinical - Medical Advice >> Jan 03, 2024  8:44 AM Chasity T wrote: Reason for CRM: Danny Navarro wife of patient is calling in for him to see if he can get in with Dr Marylynn sometime sooner than July 28th regarding concerns on nerve pain. He is very concern and wants to speak with her to see if she can refer him to someone or see what she can do for him regarding the pain and concerns. Wife is asking to contact him back at [phone] to discuss possible appointments and concern.

## 2024-01-03 NOTE — Telephone Encounter (Signed)
 Pt scheduled an appt for next Tuesday at 9:30

## 2024-01-09 ENCOUNTER — Ambulatory Visit: Admitting: Internal Medicine

## 2024-01-09 ENCOUNTER — Encounter: Payer: Self-pay | Admitting: Internal Medicine

## 2024-01-09 VITALS — BP 122/76 | HR 90 | Temp 98.0°F | Ht 72.0 in | Wt 193.4 lb

## 2024-01-09 DIAGNOSIS — G8929 Other chronic pain: Secondary | ICD-10-CM

## 2024-01-09 DIAGNOSIS — E782 Mixed hyperlipidemia: Secondary | ICD-10-CM

## 2024-01-09 DIAGNOSIS — M791 Myalgia, unspecified site: Secondary | ICD-10-CM | POA: Diagnosis not present

## 2024-01-09 DIAGNOSIS — I77810 Thoracic aortic ectasia: Secondary | ICD-10-CM | POA: Diagnosis not present

## 2024-01-09 DIAGNOSIS — R102 Pelvic and perineal pain: Secondary | ICD-10-CM

## 2024-01-09 DIAGNOSIS — I7121 Aneurysm of the ascending aorta, without rupture: Secondary | ICD-10-CM

## 2024-01-09 LAB — COMPREHENSIVE METABOLIC PANEL WITH GFR
ALT: 23 U/L (ref 0–53)
AST: 16 U/L (ref 0–37)
Albumin: 4.7 g/dL (ref 3.5–5.2)
Alkaline Phosphatase: 56 U/L (ref 39–117)
BUN: 19 mg/dL (ref 6–23)
CO2: 30 meq/L (ref 19–32)
Calcium: 9.6 mg/dL (ref 8.4–10.5)
Chloride: 100 meq/L (ref 96–112)
Creatinine, Ser: 0.69 mg/dL (ref 0.40–1.50)
GFR: 94.79 mL/min (ref >60.00)
Glucose, Bld: 94 mg/dL (ref 70–99)
Potassium: 4.8 meq/L (ref 3.5–5.1)
Sodium: 136 meq/L (ref 135–145)
Total Bilirubin: 0.5 mg/dL (ref 0.2–1.2)
Total Protein: 6.7 g/dL (ref 6.0–8.3)

## 2024-01-09 LAB — CK: Total CK: 151 U/L (ref 17–232)

## 2024-01-09 MED ORDER — PANTOPRAZOLE SODIUM 40 MG PO TBEC: 40.0000 mg | DELAYED_RELEASE_TABLET | Freq: Every day | ORAL | 3 refills | Status: DC

## 2024-01-09 MED ORDER — CELECOXIB 200 MG PO CAPS: 200.0000 mg | ORAL_CAPSULE | Freq: Two times a day (BID) | ORAL | 1 refills | Status: DC

## 2024-01-09 NOTE — Patient Instructions (Addendum)
 Stop crestor   Checking muscle enzyme today .  If elevated,  no more crestor .  If normal we can  consider resuming down the road   Start celebrex  twice daily for inflammation perineal area . Add tylenol if needed for pain management (You can take 3000 mg of acetominophen (tylenol) every day safely  In divided doses (750 mg  every 6 hours  Or 1000 mg every 8 hours.)   No rides for the next 2-3 weeks.  Let the area heal.   Return in 6 weeks for repeat labs (no rides for 48 hours prior )  Take protonix  to protect stomach from celebrex .  Best taken on an empty  stomach   30  minutes  prior to food or drink  Do not take motrin or aleve while taking celebrex  .  But tylenol ok to take .

## 2024-01-09 NOTE — Progress Notes (Unsigned)
 Subjective:  Patient ID: Danny Navarro, male    DOB: 1956/03/29  Age: 68 y.o. MRN: [id number]  CC: There were no encounter diagnoses.   HPI Danny Navarro presents for  Chief Complaint  Patient presents with   Acute Visit    Nerve pain in his saddle area x 6-8 weeks   Has seen Gen Surg for perineal mass in June   who reviewed the 2021 CT and discussed with him that  I do not think any kind of resection of any of the subcutaneous Navarro would be helpful as this would just cause more Navarro trauma. I recommended that he continue to work with the cycling professional to see if there is a better saddle or writing pants that helped the trauma. We will plan to see him again in 6 months to make sure that there is no significant interval change.  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 This is unchanged since prior study.   Outpatient Medications Prior to Visit  Medication Sig Dispense Refill   fluorouracil  (EFUDEX ) 5 % cream Apply twice a day for 10 days to affected areas including scalp and right tragus and ear lobe, left ear helix/antihelix and right nose 30 g 2   Multiple Vitamin (MULTI-VITAMIN) tablet Take by mouth.     rosuvastatin  (CRESTOR ) 5 MG tablet TAKE 1 TABLET BY MOUTH DAILY 30 tablet 0   telmisartan  (MICARDIS ) 40 MG tablet Take 1 tablet (40 mg total) by mouth daily. 90 tablet 1   vitamin C (ASCORBIC ACID) 500 MG tablet Take 500 mg by mouth daily.     aspirin  EC 81 MG tablet Take 1 tablet (81 mg total) by mouth daily. Swallow whole. (Patient not taking: Reported on 01/09/2024)     No facility-administered medications prior to visit.    Review of Systems;  Patient denies headache, fevers, malaise, unintentional weight loss, skin rash, eye pain, sinus congestion and sinus pain, sore throat, dysphagia,  hemoptysis , cough,  dyspnea, wheezing, chest pain, palpitations, orthopnea, edema, abdominal pain, nausea, melena, diarrhea, constipation, flank pain, dysuria, hematuria, urinary  Frequency, nocturia, numbness, tingling, seizures,  Focal weakness, Loss of consciousness,  Tremor, insomnia, depression, anxiety, and suicidal ideation.      Objective:  BP 122/76   Pulse 90   Temp 98 F (36.7 C)   Ht 6' (1.829 m)   Wt 193 lb 6.4 oz (87.7 kg)   SpO2 98%   BMI 26.23 kg/m   BP Readings from Last 3 Encounters:  01/09/24 122/76  12/05/23 (!) 150/85  11/29/23 (!) 145/98    Wt Readings from Last 3 Encounters:  01/09/24 193 lb 6.4 oz (87.7 kg)  12/05/23 190 lb (86.2 kg)  11/29/23 185 lb (83.9 kg)    Physical Exam  Lab Results  Component Value Date   HGBA1C 5.6 04/16/2019    Lab Results  Component Value Date   CREATININE 0.68 10/23/2023   CREATININE 0.70 04/25/2023   CREATININE 0.66 10/17/2022    Lab Results  Component Value Date   WBC 4.1 04/15/2022   HGB 14.4 04/15/2022   HCT 42.7 04/15/2022   PLT 263.0 04/15/2022   GLUCOSE 99 10/23/2023   CHOL 153 10/23/2023   TRIG 129.0 10/23/2023   HDL 44.40 10/23/2023   LDLDIRECT 80.0 10/23/2023   LDLCALC 83 10/23/2023  ALT 26 10/23/2023   AST 19 10/23/2023   NA 137 10/23/2023   K 4.4 10/23/2023   CL 101 10/23/2023   CREATININE 0.68 10/23/2023   BUN 18 10/23/2023   CO2 28 10/23/2023   TSH 1.29 04/25/2023   PSA 1.10 04/25/2023   HGBA1C 5.6 04/16/2019    CT PELVIS W CONTRAST Result Date: 12/23/2023 CLINICAL DATA:  Perineal mass EXAM: CT PELVIS WITH CONTRAST TECHNIQUE: Multidetector CT imaging of the pelvis was performed using the standard protocol following the bolus administration of intravenous contrast. RADIATION DOSE REDUCTION: This exam was performed according to the departmental dose-optimization program which includes automated exposure control, adjustment of the mA and/or kV according to patient size and/or use of iterative reconstruction  technique. CONTRAST:  OMNIPAQUE  IOHEXOL  300 MG/ML  SOLN COMPARISON:  05/19/2020 FINDINGS: Urinary Tract:  No abnormality visualized. Bowel:  Pelvic large and small bowel unremarkable. Vascular/Lymphatic: Iliofemoral vessels unremarkable. No aneurysm or adenopathy. Reproductive:  No visible focal abnormality. Other: No free fluid or free air. Again noted in the perineal region bilaterally is nodular induration, right greater than left. Area on the right measures up to 1.1 mm in thickness. Area on the left measures approximately 4 mm in thickness. These measurements and the appearance is not significantly changed since prior study. Musculoskeletal: No acute bony abnormality. Small left inguinal hernia containing fat, unchanged. 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 This is unchanged since prior study. Small left inguinal hernia containing fat, unchanged. Electronically Signed   By: Franky Crease M.D.   On: 12/23/2023 22:11    Assessment & Plan:  .There are no diagnoses linked to this encounter.   I spent 34 minutes on the day of this face to face encounter reviewing patient's  most recent visit with cardiology,  nephrology,  and neurology,  prior relevant surgical and non surgical procedures, recent  labs and imaging studies, counseling on weight management,  reviewing the assessment and plan with patient, and post visit ordering and reviewing of  diagnostics and therapeutics with patient  .   Follow-up: No follow-ups on file.   Verneita LITTIE Kettering, MD

## 2024-01-10 DIAGNOSIS — R102 Pelvic and perineal pain: Secondary | ICD-10-CM | POA: Insufficient documentation

## 2024-01-10 NOTE — Assessment & Plan Note (Signed)
Unchanged fro 2019 to 2023 by repeat Coronary calcium CT.  Continue to keep BP  120/70. He declines annual follow up imaging

## 2024-01-10 NOTE — Assessment & Plan Note (Signed)
 HE has been advised by dorn pouch, MD to discontinue crestor   nd is now apprehensive about continuing the medication  despite  his CAC score having been decreased by statin therapy  .  Suggested he sto p the medication and reasses  .

## 2024-01-10 NOTE — Assessment & Plan Note (Signed)
 Secondary to prolonged cycling.  Celebrex  ,  tylenol ACTIVITY MODIFICATION .  PPI FOR PROPHYLAXS

## 2024-01-11 ENCOUNTER — Ambulatory Visit: Payer: Self-pay | Admitting: Internal Medicine

## 2024-01-11 DIAGNOSIS — M5416 Radiculopathy, lumbar region: Secondary | ICD-10-CM | POA: Diagnosis not present

## 2024-01-15 ENCOUNTER — Ambulatory Visit: Admitting: Internal Medicine

## 2024-01-17 DIAGNOSIS — M5416 Radiculopathy, lumbar region: Secondary | ICD-10-CM | POA: Diagnosis not present

## 2024-01-19 ENCOUNTER — Other Ambulatory Visit: Payer: Self-pay | Admitting: Internal Medicine

## 2024-02-01 ENCOUNTER — Other Ambulatory Visit: Payer: Self-pay | Admitting: Internal Medicine

## 2024-02-22 ENCOUNTER — Other Ambulatory Visit: Payer: Self-pay | Admitting: Internal Medicine

## 2024-02-22 ENCOUNTER — Other Ambulatory Visit (INDEPENDENT_AMBULATORY_CARE_PROVIDER_SITE_OTHER)

## 2024-02-22 DIAGNOSIS — E782 Mixed hyperlipidemia: Secondary | ICD-10-CM

## 2024-02-22 DIAGNOSIS — M791 Myalgia, unspecified site: Secondary | ICD-10-CM | POA: Diagnosis not present

## 2024-02-22 LAB — COMPREHENSIVE METABOLIC PANEL WITH GFR
ALT: 26 U/L (ref 0–53)
AST: 16 U/L (ref 0–37)
Albumin: 4.3 g/dL (ref 3.5–5.2)
Alkaline Phosphatase: 61 U/L (ref 39–117)
BUN: 15 mg/dL (ref 6–23)
CO2: 31 meq/L (ref 19–32)
Calcium: 9.4 mg/dL (ref 8.4–10.5)
Chloride: 100 meq/L (ref 96–112)
Creatinine, Ser: 0.6 mg/dL (ref 0.40–1.50)
GFR: 98.79 mL/min (ref >60.00)
Glucose, Bld: 111 mg/dL — ABNORMAL HIGH (ref 70–99)
Potassium: 4.7 meq/L (ref 3.5–5.1)
Sodium: 139 meq/L (ref 135–145)
Total Bilirubin: 0.4 mg/dL (ref 0.2–1.2)
Total Protein: 6.5 g/dL (ref 6.0–8.3)

## 2024-02-22 LAB — CK: Total CK: 109 U/L (ref 17–232)

## 2024-02-23 LAB — LIPID PANEL W/REFLEX DIRECT LDL
Cholesterol: 275 mg/dL — ABNORMAL HIGH (ref <200)
HDL: 41 mg/dL (ref >=40)
Non-HDL Cholesterol (Calc): 234 mg/dL — ABNORMAL HIGH (ref <130)
Total CHOL/HDL Ratio: 6.7 (calc) — ABNORMAL HIGH (ref <5.0)
Triglycerides: 402 mg/dL — ABNORMAL HIGH (ref <150)

## 2024-02-23 LAB — DIRECT LDL: Direct LDL: 124 mg/dL — ABNORMAL HIGH (ref <100)

## 2024-03-30 ENCOUNTER — Other Ambulatory Visit: Payer: Self-pay | Admitting: Internal Medicine

## 2024-04-09 DIAGNOSIS — M5416 Radiculopathy, lumbar region: Secondary | ICD-10-CM | POA: Diagnosis not present

## 2024-04-25 ENCOUNTER — Encounter: Payer: Self-pay | Admitting: Internal Medicine

## 2024-04-25 ENCOUNTER — Ambulatory Visit: Admitting: Internal Medicine

## 2024-04-25 ENCOUNTER — Encounter: Payer: Self-pay | Admitting: Pharmacist

## 2024-04-25 VITALS — BP 117/74 | HR 81 | Ht 72.0 in | Wt 194.8 lb

## 2024-04-25 DIAGNOSIS — R972 Elevated prostate specific antigen [PSA]: Secondary | ICD-10-CM

## 2024-04-25 DIAGNOSIS — I700 Atherosclerosis of aorta: Secondary | ICD-10-CM

## 2024-04-25 DIAGNOSIS — E782 Mixed hyperlipidemia: Secondary | ICD-10-CM

## 2024-04-25 DIAGNOSIS — R7301 Impaired fasting glucose: Secondary | ICD-10-CM | POA: Diagnosis not present

## 2024-04-25 DIAGNOSIS — R5383 Other fatigue: Secondary | ICD-10-CM | POA: Diagnosis not present

## 2024-04-25 DIAGNOSIS — Z125 Encounter for screening for malignant neoplasm of prostate: Secondary | ICD-10-CM

## 2024-04-25 DIAGNOSIS — M791 Myalgia, unspecified site: Secondary | ICD-10-CM

## 2024-04-25 DIAGNOSIS — I10 Essential (primary) hypertension: Secondary | ICD-10-CM

## 2024-04-25 DIAGNOSIS — Z0000 Encounter for general adult medical examination without abnormal findings: Secondary | ICD-10-CM

## 2024-04-25 DIAGNOSIS — T466X5A Adverse effect of antihyperlipidemic and antiarteriosclerotic drugs, initial encounter: Secondary | ICD-10-CM

## 2024-04-25 LAB — COMPREHENSIVE METABOLIC PANEL WITH GFR
ALT: 24 U/L (ref 0–53)
AST: 18 U/L (ref 0–37)
Albumin: 4.5 g/dL (ref 3.5–5.2)
Alkaline Phosphatase: 62 U/L (ref 39–117)
BUN: 19 mg/dL (ref 6–23)
CO2: 30 meq/L (ref 19–32)
Calcium: 9.3 mg/dL (ref 8.4–10.5)
Chloride: 102 meq/L (ref 96–112)
Creatinine, Ser: 0.65 mg/dL (ref 0.40–1.50)
GFR: 96.32 mL/min (ref >60.00)
Glucose, Bld: 102 mg/dL — ABNORMAL HIGH (ref 70–99)
Potassium: 4.5 meq/L (ref 3.5–5.1)
Sodium: 139 meq/L (ref 135–145)
Total Bilirubin: 0.6 mg/dL (ref 0.2–1.2)
Total Protein: 6.6 g/dL (ref 6.0–8.3)

## 2024-04-25 LAB — LIPID PANEL
Cholesterol: 274 mg/dL — ABNORMAL HIGH (ref 0–200)
HDL: 39.3 mg/dL (ref >39.00)
LDL Cholesterol: 189 mg/dL — ABNORMAL HIGH (ref 0–99)
NonHDL: 234.64
Total CHOL/HDL Ratio: 7
Triglycerides: 229 mg/dL — ABNORMAL HIGH (ref 0.0–149.0)
VLDL: 45.8 mg/dL — ABNORMAL HIGH (ref 0.0–40.0)

## 2024-04-25 LAB — CBC WITH DIFFERENTIAL/PLATELET
Basophils Absolute: 0 K/uL (ref 0.0–0.1)
Basophils Relative: 1.3 % (ref 0.0–3.0)
Eosinophils Absolute: 0.1 K/uL (ref 0.0–0.7)
Eosinophils Relative: 3.7 % (ref 0.0–5.0)
HCT: 43.6 % (ref 39.0–52.0)
Hemoglobin: 14.9 g/dL (ref 13.0–17.0)
Lymphocytes Relative: 40.9 % (ref 12.0–46.0)
Lymphs Abs: 1.4 K/uL (ref 0.7–4.0)
MCHC: 34.3 g/dL (ref 30.0–36.0)
MCV: 88.7 fl (ref 78.0–100.0)
Monocytes Absolute: 0.5 K/uL (ref 0.1–1.0)
Monocytes Relative: 13.1 % — ABNORMAL HIGH (ref 3.0–12.0)
Neutro Abs: 1.4 K/uL (ref 1.4–7.7)
Neutrophils Relative %: 41 % — ABNORMAL LOW (ref 43.0–77.0)
Platelets: 264 K/uL (ref 150.0–400.0)
RBC: 4.91 Mil/uL (ref 4.22–5.81)
RDW: 13.7 % (ref 11.5–15.5)
WBC: 3.5 K/uL — ABNORMAL LOW (ref 4.0–10.5)

## 2024-04-25 LAB — MICROALBUMIN / CREATININE URINE RATIO
Creatinine,U: 83.7 mg/dL
Microalb Creat Ratio: UNDETERMINED mg/g (ref 0.0–30.0)
Microalb, Ur: <0.7 mg/dL

## 2024-04-25 LAB — TSH: TSH: 1.3 u[IU]/mL (ref 0.35–5.50)

## 2024-04-25 LAB — LDL CHOLESTEROL, DIRECT: Direct LDL: 142 mg/dL

## 2024-04-25 LAB — HEMOGLOBIN A1C: Hgb A1c MFr Bld: 5.7 % (ref 4.6–6.5)

## 2024-04-25 LAB — PSA, MEDICARE: PSA: 1.01 ng/mL (ref 0.10–4.00)

## 2024-04-25 NOTE — Patient Instructions (Signed)
 Today we discussed starting a medication called Zetia  if your LDL wasn't < 100 on diet alone If you are willing to try Zetia , you may tolerate it better than the statins that you did not tolerate previously.  It works by inhibiting the absorption of cholesterol by the small intestine, so it lowers LDL . It is very well tolerated by the majority of statin intolerant patients who try it.

## 2024-04-25 NOTE — Progress Notes (Signed)
 Pharmacy Quality Measure Review  This patient is appearing on a report for being at risk of failing the adherence measure for cholesterol (statin) medications this calendar year.   Medication: rosuvastatin  5 mg Last fill date: 02/22/24 for 30 day supply  Per PCP chart notes, patient has recently discontinued this medication d/t c/f side effects.  No further action.

## 2024-04-25 NOTE — Assessment & Plan Note (Signed)
 Normal prostate per evaluation by stoioff.  Repeat annual PSA today  Lab Results  Component Value Date   PSA 1.10 04/25/2023   PSA 0.86 10/16/2020   PSA 1.05 04/16/2019

## 2024-04-25 NOTE — Assessment & Plan Note (Addendum)
 elevation noted today.  Patient regularly checks  bp at home and readings are < 130/80  on ALL recent occasions.  .  He  has no microalbuminuria ; no changes today

## 2024-04-25 NOTE — Assessment & Plan Note (Signed)
 MUSCLE PAIN RESOLVED WITH STATIN CESSATION.  WILLING TO TRY ZETIA 

## 2024-04-25 NOTE — Assessment & Plan Note (Signed)
 LDL was < 100 on rosuvastatin  but he stopped it due to statin myalgia.  Will start zetia  for LDL > 100 given  AA and CAC score of 174 by 2024 evaluation

## 2024-04-25 NOTE — Progress Notes (Signed)
 Patient ID: Danny Navarro, male    DOB: Jul 29, 1955  Age: 68 y.o. MRN: [id number]  The patient is here for  follow up  and management of other chronic and acute problems.   The risk factors are reflected in the social history.   The roster of all physicians providing medical care to patient - is listed in the Snapshot section of the chart.   Activities of daily living:  The patient is 100% independent in all ADLs: dressing, toileting, feeding as well as independent mobility   Home safety : The patient has smoke detectors in the home. They wear seatbelts.  There are no unsecured firearms at home. There is no violence in the home.    There is no risks for hepatitis, STDs or HIV. There is no   history of blood transfusion. They have no travel history to infectious disease endemic areas of the world.   The patient has seen their dentist in the last six month. They have seen their eye doctor in the last year. The patinet  denies slight hearing difficulty with regard to whispered voices and some television programs.  They have deferred audiologic testing in the last year.  They do not  have excessive sun exposure. Discussed the need for sun protection: hats, long sleeves and use of sunscreen if there is significant sun exposure.    Diet: the importance of a healthy diet is discussed. They do have a healthy diet.   The benefits of regular aerobic exercise were discussed. The patient  exercises  5 days per week  for  60 minutes. Or more  often bicycling 35 miles     Depression screen: there are no signs or vegative symptoms of depression- irritability, change in appetite, anhedonia, sadness/tearfullness.   The following portions of the patient's history were reviewed and updated as appropriate: allergies, current medications, past family history, past medical history,  past surgical history, past social history  and problem list.   Visual acuity was not assessed per patient preference since the patient  has regular follow up with an  ophthalmologist. Hearing and body mass index were assessed and reviewed.    During the course of the visit the patient was educated and counseled about appropriate screening and preventive services including : fall prevention , diabetes screening, nutrition counseling, colorectal cancer screening, and recommended immunizations.    Chief Complaint:  None      Review of Symptoms  Patient denies headache, fevers, malaise, unintentional weight loss, skin rash, eye pain, sinus congestion and sinus pain, sore throat, dysphagia,  hemoptysis , cough, dyspnea, wheezing, chest pain, palpitations, orthopnea, edema, abdominal pain, nausea, melena, diarrhea, constipation, flank pain, dysuria, hematuria, urinary  Frequency, nocturia, numbness, tingling, seizures,  Focal weakness, Loss of consciousness,  Tremor, insomnia, depression, anxiety, and suicidal ideation.    Physical Exam:  BP 117/74 (Cuff Size: Normal)   Pulse 81   Ht 6' (1.829 m)   Wt 194 lb 12.8 oz (88.4 kg)   SpO2 96%   BMI 26.42 kg/m    Physical Exam Vitals reviewed.  Constitutional:      General: He is not in acute distress.    Appearance: Normal appearance. He is normal weight. He is not ill-appearing, toxic-appearing or diaphoretic.  HENT:     Head: Normocephalic.  Eyes:     General: No scleral icterus.       Right eye: No discharge.        Left eye: No discharge.  Conjunctiva/sclera: Conjunctivae normal.  Cardiovascular:     Rate and Rhythm: Normal rate and regular rhythm.     Heart sounds: Normal heart sounds.  Pulmonary:     Effort: Pulmonary effort is normal. No respiratory distress.     Breath sounds: Normal breath sounds.  Musculoskeletal:        General: Normal range of motion.     Cervical back: Normal range of motion.  Skin:    General: Skin is warm and dry.  Neurological:     General: No focal deficit present.     Mental Status: He is alert and oriented to person, place,  and time. Mental status is at baseline.  Psychiatric:        Mood and Affect: Mood normal.        Behavior: Behavior normal.        Thought Content: Thought content normal.        Judgment: Judgment normal.     Assessment and Plan: Elevated blood pressure reading in office with white coat syndrome, with diagnosis of hypertension Assessment & Plan: elevation noted today.  Patient regularly checks  bp at home and readings are < 130/80  on ALL recent occasions.  .  He  has no microalbuminuria ; no changes today   Orders: -     Comprehensive metabolic panel with GFR -     Microalbumin / creatinine urine ratio  Mixed hyperlipidemia -     Lipid panel -     LDL cholesterol, direct  Impaired fasting glucose -     Comprehensive metabolic panel with GFR -     Hemoglobin A1c  Other fatigue -     CBC with Differential/Platelet -     TSH  Prostate cancer screening -     PSA, Medicare  Abnormal prostate specific antigen Assessment & Plan: Normal prostate per evaluation by stoioff.  Repeat annual PSA today  Lab Results  Component Value Date   PSA 1.10 04/25/2023   PSA 0.86 10/16/2020   PSA 1.05 04/16/2019       Aortic atherosclerosis Assessment & Plan: LDL was < 100 on rosuvastatin  but he stopped it due to statin myalgia.  Will start zetia  for LDL > 100 given  AA and CAC score of 174 by 2024 evaluation    Myalgia due to statin Assessment & Plan: MUSCLE PAIN RESOLVED WITH STATIN CESSATION.  WILLING TO TRY ZETIA     Routine general medical examination at a health care facility Assessment & Plan: age appropriate education and counseling updated, referrals for preventative services and immunizations addressed, dietary and smoking counseling addressed, most recent labs reviewed.  I have personally reviewed and have noted:   1) the patient's medical and social history 2) The pt's use of alcohol, tobacco, and illicit drugs 3) The patient's current medications and  supplements 4) Functional ability including ADL's, fall risk, home safety risk, hearing and visual impairment 5) Diet and physical activities 6) Evidence for depression or mood disorder 7) The patient's height, weight, and BMI have been recorded in the chart    I have made referrals, and provided counseling and education based on review of the above      No follow-ups on file.  Verneita LITTIE Kettering, MD

## 2024-04-27 NOTE — Assessment & Plan Note (Signed)

## 2024-04-28 ENCOUNTER — Ambulatory Visit: Payer: Self-pay | Admitting: Internal Medicine

## 2024-04-28 NOTE — Assessment & Plan Note (Signed)
 HE has stopped Crestor  on the advice of Dorn Pouch, MD due to persistent myalgias.   his elevated CAC score did decrease with  statin therapy, but his 10 yr risk  is now 24 % .  Recommend trial of zetia    Lab Results  Component Value Date   CHOL 274 (H) 04/25/2024   HDL 39.30 04/25/2024   LDLCALC 189 (H) 04/25/2024   LDLDIRECT 142.0 04/25/2024   TRIG 229.0 (H) 04/25/2024   CHOLHDL 7 04/25/2024

## 2024-04-29 MED ORDER — CELECOXIB 200 MG PO CAPS: 200.0000 mg | ORAL_CAPSULE | Freq: Two times a day (BID) | ORAL | 1 refills | Status: AC

## 2024-04-29 NOTE — Progress Notes (Unsigned)
 Cardiology Office Note  Date:  04/30/2024   ID:  Danny Navarro, DOB Oct 28, 1955, MRN [id number]  PCP:  Marylynn Verneita CROME, MD   Chief Complaint  Patient presents with   12 month follow up     Patient would like to discuss cholesterol and Crestor  causing muscle aches.     HPI:  Danny Navarro is a 68 year old gentleman with past medical history of Hyperlipidemia PVCs Hypertension Aortic atherosclerosis on CT CT calcium  score 174 in September 2024 Mildly ascending aortic aneurysm measuring 42 mm Who presents for palpitations, PVCs, cardiac risk factors  Last seen by myself in clinic 5/23  In follow-up today reports he is an avid biker, He has developed Crestor  myalgia He has stopped the Crestor  medication Currently not on Zetia   Followed by Dr. Marylynn Blood pressure has been well controlled on telmisartan  40 daily  Works in government social research [employer] Very busy and stressful at work  CT scan 2019   mild coronary calcification in the RCA, mildly dilated ascending aorta  EKG personally reviewed by myself on todays visit Normal sinus rhythm rate 86 bpm no significant ST-T wave changes  Family hx Mom was smoker, CAD Dad died of cancer   PMH:   has a past medical history of Anxiety, Hyperlipidemia, Hypertension, and Screening (06/04/2008).  PSH:    Past Surgical History:  Procedure Laterality Date   BACK SURGERY  1998   x2    Current Outpatient Medications  Medication Sig Dispense Refill   aspirin  EC 81 MG tablet Take 1 tablet (81 mg total) by mouth daily. Swallow whole.     celecoxib  (CELEBREX ) 200 MG capsule Take 1 capsule (200 mg total) by mouth 2 (two) times daily. 180 capsule 1   ezetimibe  (ZETIA ) 10 MG tablet Take 1 tablet (10 mg total) by mouth daily. 90 tablet 3   fluorouracil  (EFUDEX ) 5 % cream Apply twice a day for 10 days to affected areas including scalp and right tragus and ear lobe, left ear helix/antihelix and right nose 30 g 2   Multiple Vitamin (MULTI-VITAMIN)  tablet Take by mouth.     pantoprazole  (PROTONIX ) 40 MG tablet TAKE 1 TABLET BY MOUTH ONCE DAILY 30 tablet 3   telmisartan  (MICARDIS ) 40 MG tablet TAKE ONE TABLET BY MOUTH ONCE DAILY 90 tablet 1   vitamin C (ASCORBIC ACID) 500 MG tablet Take 500 mg by mouth daily.     rosuvastatin  (CRESTOR ) 5 MG tablet TAKE 1 TABLET BY MOUTH DAILY (Patient not taking: Reported on 04/30/2024) 30 tablet 1   No current facility-administered medications for this visit.     Allergies:   Declomycin [demeclocycline]   Social History:  The patient  reports that he quit smoking about 45 years ago. His smoking use included cigarettes. He has been exposed to tobacco smoke. He has never used smokeless tobacco. He reports that he does not drink alcohol and does not use drugs.   Family History:   family history includes Cancer in his father; Coronary artery disease in his mother; Heart attack in his mother; Heart disease in his mother; Kidney disease in his mother; Multiple myeloma in his father.    Review of Systems: Review of Systems  Constitutional: Negative.   HENT: Negative.    Respiratory: Negative.    Cardiovascular: Negative.   Gastrointestinal: Negative.   Musculoskeletal: Negative.   Neurological: Negative.   Psychiatric/Behavioral: Negative.    All other systems reviewed and are negative.   PHYSICAL EXAM: VS:  BP  120/80 (BP Location: Left Arm, Patient Position: Sitting, Cuff Size: Normal)   Pulse 86   Ht 6' (1.829 m)   Wt 194 lb 4 oz (88.1 kg)   SpO2 98%   BMI 26.35 kg/m  , BMI Body mass index is 26.35 kg/m. Constitutional:  oriented to person, place, and time. No distress.  HENT:  Head: Grossly normal Eyes:  no discharge. No scleral icterus.  Neck: No JVD, no carotid bruits  Cardiovascular: Regular rate and rhythm, no murmurs appreciated Pulmonary/Chest: Clear to auscultation bilaterally, no wheezes or rails Abdominal: Soft.  no distension.  no tenderness.  Musculoskeletal: Normal range  of motion Neurological:  normal muscle tone. Coordination normal. No atrophy Skin: Skin warm and dry Psychiatric: normal affect, pleasant   Recent Labs: 04/25/2024: ALT 24; BUN 19; Creatinine, Ser 0.65; Hemoglobin 14.9; Platelets 264.0; Potassium 4.5; Sodium 139; TSH 1.30    Lipid Panel Lab Results  Component Value Date   CHOL 274 (H) 04/25/2024   HDL 39.30 04/25/2024   LDLCALC 189 (H) 04/25/2024   TRIG 229.0 (H) 04/25/2024      Wt Readings from Last 3 Encounters:  04/30/24 194 lb 4 oz (88.1 kg)  04/25/24 194 lb 12.8 oz (88.4 kg)  01/09/24 193 lb 6.4 oz (87.7 kg)       ASSESSMENT AND PLAN:  Dilated ascending aorta Previously 4.2 cm On CT scan, September 2024 Blood pressure initially elevated, improved on recheck down to 120/80 No changes to medications continue telmisartan   Palpitations -  Denies having significant PVCs No further workup needed, asymptomatic  Hyperlipidemia Statin myalgia Recommend he start Zetia  10 mg daily Additional options include Repatha, bempedoic acid He prefers to see how cholesterol numbers go after 3 to 6 months on Zetia   Coronary calcification Calcium  score 174  Family history of coronary artery disease Mother was a long-term smoker Had cardiac disease  HYPERTENSION, BENIGN Anxious today, blood pressure elevated, typically well controlled at home using 2 blood pressure cuffs for verification No changes to his medications     Orders Placed This Encounter  Procedures   Lipid panel   EKG 12-Lead     Signed, Velinda Lunger, M.D., Ph.D. 04/30/2024  [facility] [place], [place] [phone]

## 2024-04-30 ENCOUNTER — Ambulatory Visit: Attending: Cardiovascular Disease | Admitting: Cardiovascular Disease

## 2024-04-30 ENCOUNTER — Encounter: Payer: Self-pay | Admitting: Cardiovascular Disease

## 2024-04-30 VITALS — BP 120/80 | HR 86 | Ht 72.0 in | Wt 194.2 lb

## 2024-04-30 DIAGNOSIS — I77810 Thoracic aortic ectasia: Secondary | ICD-10-CM

## 2024-04-30 DIAGNOSIS — E785 Hyperlipidemia, unspecified: Secondary | ICD-10-CM

## 2024-04-30 DIAGNOSIS — I700 Atherosclerosis of aorta: Secondary | ICD-10-CM | POA: Diagnosis not present

## 2024-04-30 DIAGNOSIS — I10 Essential (primary) hypertension: Secondary | ICD-10-CM

## 2024-04-30 DIAGNOSIS — I2510 Atherosclerotic heart disease of native coronary artery without angina pectoris: Secondary | ICD-10-CM

## 2024-04-30 MED ORDER — EZETIMIBE 10 MG PO TABS
10.0000 mg | ORAL_TABLET | Freq: Every day | ORAL | 3 refills | Status: AC
Start: 2024-04-30 — End: 2024-07-29

## 2024-04-30 NOTE — Patient Instructions (Addendum)
 Medication Instructions:   Zetia  10 mg daily  If you need a refill on your cardiac medications before your next appointment, please call your pharmacy.   Lab work: Your provider would like for you to return in 3 months to have the following labs drawn: Lipid Panel.   Please go to [facility] [address] ([facility] [address], [place] [address] You do not need an appointment.  They are open from 8 am- 4:30 pm.  Lunch from 1:00 pm- 2:00 pm You will need to be fasting.   Testing/Procedures: No new testing needed  Follow-Up: At [facility], you and your health needs are our priority.  As part of our continuing mission to provide you with exceptional heart care, we have created designated Provider Care Teams.  These Care Teams include your primary Cardiologist (physician) and Advanced Practice Providers (APPs -  Physician Assistants and Nurse Practitioners) who all work together to provide you with the care you need, when you need it.  You will need a follow up appointment in 12 months  Providers on your designated Care Team:   Lonni Meager, NP Bernardino Bring, PA-C [place] [place], [place]  COVID-19 Vaccine Information can be found at: [email or web address] For questions related to vaccine distribution or appointments, please email [email or web address] or call [phone].

## 2024-05-28 ENCOUNTER — Ambulatory Visit: Admitting: General Surgery

## 2024-07-26 ENCOUNTER — Other Ambulatory Visit: Payer: Self-pay | Admitting: Internal Medicine

## 2024-10-31 ENCOUNTER — Encounter: Admitting: Internal Medicine

## 2024-11-12 ENCOUNTER — Ambulatory Visit

## 2024-12-03 ENCOUNTER — Ambulatory Visit: Admitting: Dermatology
